# Patient Record
Sex: Male | Born: 1995 | Race: Black or African American | Hispanic: No | Marital: Single
Health system: Midwestern US, Community
[De-identification: ages and names within clinical notes are randomized; demographics above are authoritative.]

---
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
## 2017-06-01 ENCOUNTER — Emergency Department (HOSPITAL_COMMUNITY)
Admission: EM | Admit: 2017-06-01 | Discharge: 2017-06-01 | Disposition: A | Discharge status: Home / Self Care | Payer: 59 | Attending: Emergency Medicine | Admitting: Emergency Medicine

## 2017-06-01 ENCOUNTER — Encounter (HOSPITAL_COMMUNITY): Payer: Self-pay | Admitting: Emergency Medicine

## 2017-06-01 ENCOUNTER — Other Ambulatory Visit: Payer: Self-pay

## 2017-06-01 DIAGNOSIS — S50812A Abrasion of left forearm, initial encounter: Secondary | ICD-10-CM | POA: Insufficient documentation

## 2017-06-01 DIAGNOSIS — Y939 Activity, unspecified: Secondary | ICD-10-CM | POA: Insufficient documentation

## 2017-06-01 DIAGNOSIS — Y999 Unspecified external cause status: Secondary | ICD-10-CM | POA: Diagnosis not present

## 2017-06-01 DIAGNOSIS — F329 Major depressive disorder, single episode, unspecified: Secondary | ICD-10-CM | POA: Diagnosis present

## 2017-06-01 DIAGNOSIS — Z23 Encounter for immunization: Secondary | ICD-10-CM | POA: Diagnosis not present

## 2017-06-01 DIAGNOSIS — R45851 Suicidal ideations: Secondary | ICD-10-CM | POA: Insufficient documentation

## 2017-06-01 DIAGNOSIS — X781XXA Intentional self-harm by knife, initial encounter: Secondary | ICD-10-CM | POA: Insufficient documentation

## 2017-06-01 DIAGNOSIS — F4325 Adjustment disorder with mixed disturbance of emotions and conduct: Secondary | ICD-10-CM | POA: Diagnosis not present

## 2017-06-01 DIAGNOSIS — Y929 Unspecified place or not applicable: Secondary | ICD-10-CM | POA: Insufficient documentation

## 2017-06-01 LAB — COMPREHENSIVE METABOLIC PANEL
ALBUMIN: 4.5 g/dL (ref 3.5–5.0)
ALT: 26 U/L (ref 17–63)
AST: 35 U/L (ref 15–41)
Alkaline Phosphatase: 55 U/L (ref 38–126)
Anion gap: 11 (ref 5–15)
BUN: 12 mg/dL (ref 6–20)
CALCIUM: 9.2 mg/dL (ref 8.9–10.3)
CO2: 23 mmol/L (ref 22–32)
CREATININE: 1.02 mg/dL (ref 0.61–1.24)
Chloride: 107 mmol/L (ref 101–111)
GFR calc Af Amer: >60 mL/min (ref >60)
GFR calc non Af Amer: >60 mL/min (ref >60)
GLUCOSE: 83 mg/dL (ref 65–99)
Potassium: 3.4 mmol/L — ABNORMAL LOW (ref 3.5–5.1)
SODIUM: 141 mmol/L (ref 135–145)
Total Bilirubin: 0.7 mg/dL (ref 0.3–1.2)
Total Protein: 7.8 g/dL (ref 6.5–8.1)

## 2017-06-01 LAB — CBC
HEMATOCRIT: 44.5 % (ref 39.0–52.0)
HEMOGLOBIN: 14.7 g/dL (ref 13.0–17.0)
MCH: 28.1 pg (ref 26.0–34.0)
MCHC: 33 g/dL (ref 30.0–36.0)
MCV: 84.9 fL (ref 78.0–100.0)
Platelets: 241 10*3/uL (ref 150–400)
RBC: 5.24 MIL/uL (ref 4.22–5.81)
RDW: 14 % (ref 11.5–15.5)
WBC: 5.1 10*3/uL (ref 4.0–10.5)

## 2017-06-01 LAB — SALICYLATE LEVEL: Salicylate Lvl: <7 mg/dL (ref 2.8–30.0)

## 2017-06-01 LAB — ETHANOL: Alcohol, Ethyl (B): <10 mg/dL (ref <10)

## 2017-06-01 LAB — ACETAMINOPHEN LEVEL: Acetaminophen (Tylenol), Serum: <10 ug/mL — ABNORMAL LOW (ref 10–30)

## 2017-06-01 MED ORDER — ACETAMINOPHEN 500 MG PO TABS: 500.0000 mg | ORAL_TABLET | ORAL | Status: DC | PRN

## 2017-06-01 MED ORDER — IBUPROFEN 200 MG PO TABS: 600.0000 mg | ORAL_TABLET | Freq: Four times a day (QID) | ORAL | Status: DC | PRN

## 2017-06-01 MED ORDER — TETANUS-DIPHTH-ACELL PERTUSSIS 5-2.5-18.5 LF-MCG/0.5 IM SUSP
0.5000 mL | Freq: Once | INTRAMUSCULAR | Status: AC
Start: 2017-06-01 — End: 2017-06-01
  Administered 2017-06-01: 0.5 mL via INTRAMUSCULAR
  Filled 2017-06-01: qty 0.5
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
## 2017-06-01 NOTE — ED Notes (Signed)
Bed: [facility] Expected date:  Expected time:  Means of arrival:  Comments: 

## 2017-06-01 NOTE — ED Notes (Signed)
Pt d/c home per MD order. Discharge summary reviewed with pt. Pt verbalizes understanding. Pt denies SI/HI/AVH. Personal property returned to pt. Pt signed e-signature. Pt mother in lobby for discharge. Pt ambulatory off unit with [facility].
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
## 2017-06-01 NOTE — ED Notes (Signed)
Pt mother at bedside visiting with pt.  
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
## 2017-06-01 NOTE — ED Notes (Addendum)
Mother took pt belongings home  

## 2017-06-01 NOTE — BHH Counselor (Signed)
Per Nanine MeansJamison Lord, NP, patient does not meet inpatient criteria. Patient is psych-cleared.

## 2019-04-29 ENCOUNTER — Emergency Department (HOSPITAL_COMMUNITY)
Admission: EM | Admit: 2019-04-29 | Discharge: 2019-04-30 | Disposition: A | Discharge status: Other Acute Inpatient Hospital | Payer: 59 | Source: Home / Self Care | Attending: Emergency Medicine | Admitting: Emergency Medicine

## 2019-04-29 ENCOUNTER — Other Ambulatory Visit: Payer: Self-pay

## 2019-04-29 DIAGNOSIS — F332 Major depressive disorder, recurrent severe without psychotic features: Secondary | ICD-10-CM | POA: Insufficient documentation

## 2019-04-29 DIAGNOSIS — F322 Major depressive disorder, single episode, severe without psychotic features: Secondary | ICD-10-CM | POA: Diagnosis not present

## 2019-04-29 DIAGNOSIS — R45851 Suicidal ideations: Secondary | ICD-10-CM

## 2019-04-29 DIAGNOSIS — Z046 Encounter for general psychiatric examination, requested by authority: Secondary | ICD-10-CM | POA: Insufficient documentation

## 2019-04-29 DIAGNOSIS — Z20822 Contact with and (suspected) exposure to covid-19: Secondary | ICD-10-CM | POA: Insufficient documentation

## 2019-04-29 LAB — COMPREHENSIVE METABOLIC PANEL
ALT: 39 U/L (ref 0–44)
AST: 26 U/L (ref 15–41)
Albumin: 4.4 g/dL (ref 3.5–5.0)
Alkaline Phosphatase: 51 U/L (ref 38–126)
Anion gap: 11 (ref 5–15)
BUN: 8 mg/dL (ref 6–20)
CO2: 24 mmol/L (ref 22–32)
Calcium: 9.2 mg/dL (ref 8.9–10.3)
Chloride: 105 mmol/L (ref 98–111)
Creatinine, Ser: 0.89 mg/dL (ref 0.61–1.24)
GFR calc Af Amer: >60 mL/min (ref >60)
GFR calc non Af Amer: >60 mL/min (ref >60)
Glucose, Bld: 157 mg/dL — ABNORMAL HIGH (ref 70–99)
Potassium: 3.6 mmol/L (ref 3.5–5.1)
Sodium: 140 mmol/L (ref 135–145)
Total Bilirubin: 0.4 mg/dL (ref 0.3–1.2)
Total Protein: 7.6 g/dL (ref 6.5–8.1)

## 2019-04-29 LAB — SALICYLATE LEVEL: Salicylate Lvl: <7 mg/dL — ABNORMAL LOW (ref 7.0–30.0)

## 2019-04-29 LAB — ACETAMINOPHEN LEVEL: Acetaminophen (Tylenol), Serum: <10 ug/mL — ABNORMAL LOW (ref 10–30)

## 2019-04-29 LAB — CBC
HCT: 49.2 % (ref 39.0–52.0)
Hemoglobin: 15.9 g/dL (ref 13.0–17.0)
MCH: 28.4 pg (ref 26.0–34.0)
MCHC: 32.3 g/dL (ref 30.0–36.0)
MCV: 88 fL (ref 80.0–100.0)
Platelets: 296 10*3/uL (ref 150–400)
RBC: 5.59 MIL/uL (ref 4.22–5.81)
RDW: 14.2 % (ref 11.5–15.5)
WBC: 4.4 10*3/uL (ref 4.0–10.5)
nRBC: 0 % (ref 0.0–0.2)

## 2019-04-29 LAB — ETHANOL: Alcohol, Ethyl (B): 54 mg/dL — ABNORMAL HIGH (ref <10)

## 2019-04-29 NOTE — ED Provider Notes (Signed)
[facility] EMERGENCY DEPARTMENT Provider Note   CSN: [id number] Arrival date & time: 04/29/19  2223     History Chief Complaint  Patient presents with  . Suicidal    Steve Turner is a 24 y.o. male.  Patient presents to the emergency department with a chief complaint of suicidal thoughts.  He states that his suicidal thoughts have been worse than normal.  States that he tried to drink some engine fluid today, but his girlfriend stopped him.  He states that he vomited shortly thereafter.  He denies any symptoms now, but does feel suicidal still.  Denies any homicidal thoughts.  Denies any other drug or alcohol use.  Denies any recent illnesses.  The history is provided by the patient. No language interpreter was used.       No past medical history on file.  Patient Active Problem List   Diagnosis Date Noted  . Adjustment disorder with mixed disturbance of emotions and conduct 06/01/2017    No past surgical history on file.     No family history on file.  Social History   Tobacco Use  . Smoking status: Never Smoker  Substance Use Topics  . Alcohol use: Not on file  . Drug use: Not on file    Home Medications Prior to Admission medications   Not on File    Allergies    Patient has no known allergies.  Review of Systems   Review of Systems  All other systems reviewed and are negative.   Physical Exam Updated Vital Signs BP 133/72   Pulse 98   Temp 98.2 F (36.8 C) (Oral)   Resp 16   SpO2 98%   Physical Exam Vitals and nursing note reviewed.  Constitutional:      General: He is not in acute distress.    Appearance: He is well-developed. He is not ill-appearing.  HENT:     Head: Normocephalic and atraumatic.  Eyes:     Conjunctiva/sclera: Conjunctivae normal.  Cardiovascular:     Rate and Rhythm: Normal rate.  Pulmonary:     Effort: Pulmonary effort is normal. No respiratory distress.  Abdominal:     General: There is no  distension.  Musculoskeletal:     Cervical back: Neck supple.     Comments: Moves all extremities  Skin:    General: Skin is warm and dry.  Neurological:     Mental Status: He is alert and oriented to person, place, and time.  Psychiatric:        Mood and Affect: Mood normal.        Behavior: Behavior normal.     ED Results / Procedures / Treatments   Labs (all labs ordered are listed, but only abnormal results are displayed) Labs Reviewed  COMPREHENSIVE METABOLIC PANEL - Abnormal; Notable for the following components:      Result Value   Glucose, Bld 157 (*)    All other components within normal limits  ETHANOL - Abnormal; Notable for the following components:   Alcohol, Ethyl (B) 54 (*)    All other components within normal limits  SALICYLATE LEVEL - Abnormal; Notable for the following components:   Salicylate Lvl <5.3 (*)    All other components within normal limits  ACETAMINOPHEN LEVEL - Abnormal; Notable for the following components:   Acetaminophen (Tylenol), Serum <10 (*)    All other components within normal limits  CBC  RAPID URINE DRUG SCREEN, HOSP PERFORMED    EKG None  Radiology No results found.  Procedures Procedures (including critical care time)  Medications Ordered in ED Medications - No data to display  ED Course  I have reviewed the triage vital signs and the nursing notes.  Pertinent labs & imaging results that were available during my care of the patient were reviewed by me and considered in my medical decision making (see chart for details).    MDM Rules/Calculators/A&P                       Patient here with suicidal thoughts.  States he attempted to drink engine fluid, but vomited.  Vital signs are stable.  He is nontoxic-appearing.  He does not appear acidotic.    Patient is medically clear for TTS evaluation.  Final Clinical Impression(s) / ED Diagnoses Final diagnoses:  Suicidal thoughts    Rx / DC Orders ED Discharge  Orders    None       Roxy Horseman, PA-C 04/30/19 0001    Cardama, Amadeo Garnet, MD 04/30/19 [phone]
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
## 2019-04-29 NOTE — ED Notes (Signed)
Patient changed into scrubs, has been wanded by [facility] ED Security and is currently sitting in [employer]. Pts Belongings are in [address] [place] [address].

## 2019-04-30 ENCOUNTER — Encounter (HOSPITAL_COMMUNITY): Payer: Self-pay | Admitting: Nurse Practitioner

## 2019-04-30 ENCOUNTER — Inpatient Hospital Stay (HOSPITAL_COMMUNITY)
Admission: AD | Admit: 2019-04-30 | Discharge: 2019-05-04 | DRG: 885 | Disposition: A | Discharge status: Home / Self Care | Payer: 59 | Source: Intra-hospital | Attending: Psychiatry | Admitting: Psychiatry

## 2019-04-30 DIAGNOSIS — Y902 Blood alcohol level of 40-59 mg/100 ml: Secondary | ICD-10-CM | POA: Diagnosis present

## 2019-04-30 DIAGNOSIS — Z23 Encounter for immunization: Secondary | ICD-10-CM

## 2019-04-30 DIAGNOSIS — Z915 Personal history of self-harm: Secondary | ICD-10-CM

## 2019-04-30 DIAGNOSIS — F17290 Nicotine dependence, other tobacco product, uncomplicated: Secondary | ICD-10-CM | POA: Diagnosis present

## 2019-04-30 DIAGNOSIS — Z20822 Contact with and (suspected) exposure to covid-19: Secondary | ICD-10-CM | POA: Diagnosis present

## 2019-04-30 DIAGNOSIS — F322 Major depressive disorder, single episode, severe without psychotic features: Secondary | ICD-10-CM | POA: Diagnosis present

## 2019-04-30 DIAGNOSIS — R45851 Suicidal ideations: Secondary | ICD-10-CM | POA: Diagnosis present

## 2019-04-30 DIAGNOSIS — F419 Anxiety disorder, unspecified: Secondary | ICD-10-CM | POA: Diagnosis present

## 2019-04-30 DIAGNOSIS — F1024 Alcohol dependence with alcohol-induced mood disorder: Secondary | ICD-10-CM | POA: Diagnosis present

## 2019-04-30 LAB — LIPID PANEL
Cholesterol: 121 mg/dL (ref 0–200)
HDL: 46 mg/dL (ref >40)
LDL Cholesterol: 58 mg/dL (ref 0–99)
Total CHOL/HDL Ratio: 2.6 RATIO
Triglycerides: 86 mg/dL (ref <150)
VLDL: 17 mg/dL (ref 0–40)

## 2019-04-30 LAB — HEMOGLOBIN A1C
Hgb A1c MFr Bld: 5.3 % (ref 4.8–5.6)
Mean Plasma Glucose: 105.41 mg/dL

## 2019-04-30 LAB — RESPIRATORY PANEL BY RT PCR (FLU A&B, COVID)
Influenza A by PCR: NEGATIVE
Influenza B by PCR: NEGATIVE
SARS Coronavirus 2 by RT PCR: NEGATIVE

## 2019-04-30 LAB — TSH: TSH: 1.483 u[IU]/mL (ref 0.350–4.500)

## 2019-04-30 MED ORDER — LORAZEPAM 1 MG PO TABS: 1.0000 mg | ORAL_TABLET | Freq: Four times a day (QID) | ORAL | Status: AC | PRN

## 2019-04-30 MED ORDER — ACETAMINOPHEN 325 MG PO TABS: 650.0000 mg | ORAL_TABLET | Freq: Four times a day (QID) | ORAL | Status: DC | PRN

## 2019-04-30 MED ORDER — OLANZAPINE 10 MG PO TBDP: 10.0000 mg | ORAL_TABLET | Freq: Three times a day (TID) | ORAL | Status: DC | PRN

## 2019-04-30 MED ORDER — NICOTINE 14 MG/24HR TD PT24
14.0000 mg | MEDICATED_PATCH | Freq: Every day | TRANSDERMAL | Status: DC
  Administered 2019-04-30: 14 mg via TRANSDERMAL
  Filled 2019-04-30 (×3): qty 1

## 2019-04-30 MED ORDER — ZIPRASIDONE MESYLATE 20 MG IM SOLR: 20.0000 mg | INTRAMUSCULAR | Status: DC | PRN

## 2019-04-30 MED ORDER — HYDROXYZINE HCL 25 MG PO TABS
25.0000 mg | ORAL_TABLET | Freq: Three times a day (TID) | ORAL | Status: DC | PRN
  Administered 2019-05-02: 25 mg via ORAL
  Filled 2019-04-30 (×2): qty 1

## 2019-04-30 MED ORDER — PNEUMOCOCCAL VAC POLYVALENT 25 MCG/0.5ML IJ INJ
0.5000 mL | INJECTION | INTRAMUSCULAR | Status: AC
  Administered 2019-05-01: 0.5 mL via INTRAMUSCULAR

## 2019-04-30 MED ORDER — MAGNESIUM HYDROXIDE 400 MG/5ML PO SUSP: 30.0000 mL | Freq: Every day | ORAL | Status: DC | PRN

## 2019-04-30 MED ORDER — LOPERAMIDE HCL 2 MG PO CAPS: 2.0000 mg | ORAL_CAPSULE | ORAL | Status: DC | PRN

## 2019-04-30 MED ORDER — LORAZEPAM 1 MG PO TABS: 1.0000 mg | ORAL_TABLET | ORAL | Status: DC | PRN

## 2019-04-30 MED ORDER — THIAMINE HCL 100 MG PO TABS
100.0000 mg | ORAL_TABLET | Freq: Every day | ORAL | Status: DC
  Administered 2019-05-01 – 2019-05-04 (×4): 100 mg via ORAL
  Filled 2019-04-30 (×6): qty 1

## 2019-04-30 MED ORDER — TRAZODONE HCL 50 MG PO TABS: 50.0000 mg | ORAL_TABLET | Freq: Every evening | ORAL | Status: DC | PRN

## 2019-04-30 MED ORDER — ALUM & MAG HYDROXIDE-SIMETH 200-200-20 MG/5ML PO SUSP: 30.0000 mL | ORAL | Status: DC | PRN

## 2019-04-30 MED ORDER — ADULT MULTIVITAMIN W/MINERALS CH
1.0000 | ORAL_TABLET | Freq: Every day | ORAL | Status: DC
  Administered 2019-04-30 – 2019-05-04 (×5): 1 via ORAL
  Filled 2019-04-30 (×7): qty 1

## 2019-04-30 MED ORDER — ONDANSETRON 4 MG PO TBDP: 4.0000 mg | ORAL_TABLET | Freq: Four times a day (QID) | ORAL | Status: DC | PRN

## 2019-04-30 MED ORDER — FLUOXETINE HCL 10 MG PO CAPS
10.0000 mg | ORAL_CAPSULE | Freq: Every day | ORAL | Status: DC
  Administered 2019-04-30 – 2019-05-01 (×2): 10 mg via ORAL
  Filled 2019-04-30 (×3): qty 1
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
## 2019-04-30 NOTE — BH Assessment (Signed)
Nira Conn, NP, reviewed pt's chart and information and determined pt meets criteria for inpatient hospitalization. Pt has been accepted to [facility] pending a negative COVID test and can arrive anytime after the test results return negative. This information was provided to pt's nurse, Michele Mcalpine RN, at [phone].  Room: 301-2 Accepting: Nira Conn, NP Attending: Dr. Jola Babinski Call to Report: [phone]

## 2019-04-30 NOTE — BHH Suicide Risk Assessment (Addendum)
[facility] Admission Suicide Risk Assessment   Nursing information obtained from:  Patient Demographic factors:  Male, Low socioeconomic status, Living alone, Adolescent or young adult Current Mental Status:  NA Loss Factors:  Legal issues, Financial problems / change in socioeconomic status Historical Factors:  Prior suicide attempts, Impulsivity Risk Reduction Factors:  Employed, Positive social support  Total Time spent with patient: 45 minutes Principal Problem:Diagnosis:  Active Problems:   Severe major depression, single episode, without psychotic features (HCC)  Subjective Data:   Continued Clinical Symptoms:  Alcohol Use Disorder Identification Test Final Score (AUDIT): 23 The "Alcohol Use Disorders Identification Test", Guidelines for Use in Primary Care, Second Edition.  World Science writer [facility]). Score between 0-7:  no or low risk or alcohol related problems. Score between 8-15:  moderate risk of alcohol related problems. Score between 16-19:  high risk of alcohol related problems. Score 20 or above:  warrants further diagnostic evaluation for alcohol dependence and treatment.   CLINICAL FACTORS:  15 y old single male, no children, homeless, had been staying in a hotel prior to admission , reports recently lost his job due to having COVID and being unable to work. Presented to hospital voluntarily for worsening depression( which he attributes to above stressors)  neuro-vegetative symptoms ( mild anhedonia, decreased sleep, low energy level) and suicide attempt by ingesting " engine fluid" yesterday. States he ingested a slight amount before being stopped, and reports " then I started vomiting". ( no current vomiting, and reports tolerating PO intake well )  Denies psychotic symptoms. He also reports daily alcohol consumption, drinking up to a fifth of liquor per day for several months. At this time does not endorse drug abuse.  Admission BAL on 1/18 was 54, no UDS.  Patient  reports history of depression, intermittent suicidal ideations " since I was a kid", and a prior history of cutting self . Reports history of brief episodes of anger/explosiveness, but does not endorse any clear history of hypomania or mania.  Dx- MDD versus Alcohol Induced Mood Disorder . Alcohol Use Disorder  Plan- Inpatient treatment.  Alcohol detox protocol ( Ativan PRN as per CIWA) , thiamine, folate.  Has been started on Prozac 10 mgrs QDAY for depression- side effects reviewed .  Agitation protocol if needed for acute agitation.      Musculoskeletal: Strength & Muscle Tone: within normal limits no tremors, no diaphoresis, no restlessness or agitation, vitals stable. Gait & Station: normal Patient leans: N/A  Psychiatric Specialty Exam: Physical Exam  Review of Systems denies chest pain or shortness of breath, no vomiting   Blood pressure 110/68, pulse 73, temperature 98.1 F (36.7 C), temperature source Oral, resp. rate 16, height 5\' 7"  (1.702 m), weight 68 kg, SpO2 100 %.Body mass index is 23.49 kg/m.  General Appearance: Well Groomed  Eye Contact:  Fair  Speech:  Normal Rate  Volume:  Normal  Mood:  depressed   Affect:  Constricted  Thought Process:  Linear and Descriptions of Associations: Intact  Orientation:  Other:  fully alert and attentive   Thought Content:  no hallucinations,  no delusions , not internally preoccupied   Suicidal Thoughts:  No no suicidal or self injurious ideations  Homicidal Thoughts:  No  Memory:  recent and remote grossly intact   Judgement:  Fair  Insight:  Good  Psychomotor Activity:  Normal- he is currently not presenting with withdrawal symptoms- no tremors, no restlessness or psychomotor agitation  Concentration:  Concentration: Good and Attention  Span: Good  Recall:  Good  Fund of Knowledge:  Good  Language:  Good  Akathisia:  Negative  Handed:  Right  AIMS (if indicated):     Assets:  Desire for Improvement Resilience  ADL's:   Intact  Cognition:  WNL  Sleep:  Number of Hours: 0.75(d/t early morning admission)      COGNITIVE FEATURES THAT CONTRIBUTE TO RISK:  Closed-mindedness and Loss of executive function    SUICIDE RISK:   Moderate:  Frequent suicidal ideation with limited intensity, and duration, some specificity in terms of plans, no associated intent, good self-control, limited dysphoria/symptomatology, some risk factors present, and identifiable protective factors, including available and accessible social support.  PLAN OF CARE: Patient will be admitted to inpatient psychiatric unit for stabilization and safety. Will provide and encourage milieu participation. Provide medication management and maked adjustments as needed.  Will also provide medication to address alcohol WDL if needed. Will follow daily.    I certify that inpatient services furnished can reasonably be expected to improve the patient's condition.   Jenne Campus, MD 04/30/2019, 6:09 PM

## 2019-04-30 NOTE — Progress Notes (Signed)
   04/30/19 0420  Assessment Details  Time of Assessment Admission  Information Obtained From Patient  To be done on Admission  Risk Factors related to Demographics Male;Low socioeconomic status;Living alone;Adolescent or young adult  Current Mental Status NA  Loss Factors Legal issues;Financial problems / change in socioeconomic status  Historical Factors Prior suicide attempts;Impulsivity  Risk Reduction Factors Employed;Positive social support  [place] Suicide Severity Rating Scale  1. Wish to be Dead Yes  2. Suicidal Thoughts Yes  3. Suicidal Thoughts with Method Without Specific Plan or Intent to Act Yes  4. Suicidal Intent Without Specific Plan Yes  5. Suicide Intent with Specific Plan No  6. Suicide Behavior Question Yes  7. How long ago did you do any of these? Within the last three months  C-SSRS RISK CATEGORY High Risk  Patient location: Behavioral Health In-patient Unit  [facility] Suicide Precaution Interventions  [facility] Suicide Bundle Interventions Moderate Risk Interventions implemented   Pt denies current suicidal thoughts. Contracts for safety.

## 2019-04-30 NOTE — BH Assessment (Addendum)
Tele Assessment Note   Patient Name: Steve Turner MRN: [id number] Referring Physician: Roxy Horseman, PA-C Location of Patient: [facility] Location of Provider: Behavioral Health TTS Department  Steve Turner is a 24 y.o. male who was brought to [facility] following an attempt to kill himself by drinking engine fluid; pt states he found this in the back of a male friend's car, sprayed it into a water bottle drank as much as he could, and then he almost immediately vomited it back up. Pt states he attempted to kill himself by drinking brake fluid in a similar manner several weeks prior to Christmas in 2020 (approximately 6 weeks ago); he stated that, in that incident, his throat closed up and he almost immediately vomited that substance up as well, and then he fell asleep for several hours.  Pt endorses SI and a hx of SI, stating, "I've been suicidal since high school." Pt stated he cut his wrists in January 2019 and that his father called the police; he was sent to [facility] and he was assessed overnight and d/c the next day. Pt acknowledges he wasn't completely honest during the assessment, stating he "knew what to say" so he didn't have to stay longer. Pt states he has "too many" plans to kill himself. Pt denies HI, AVH, and access to guns/weapons. He shares he engages in NSSIB via cutting, stating he will use "anything" to cut himself and that the last incident of this was the same day he drank the brake fluid prior to Christmas. Pt states he has pending legal charges, including DWI, paraphernalia, hit and run, and distribution of marijuana; he states he believes his court date is May 16, 2019. Pt states he currently engages in the use of EtOH daily and that he "definitely abuses" the substance; he states he has a hx of abusing marijuana, though can't remember the last time he used it, and abused pills when he was in high school.  Pt declined to provide clinician verbal consent to contact any family  members for collateral information.  Pt is oriented x4. His recent and remote memory is intact. Pt was cooperative and friendly throughout the assessment process. Pt's insight and judgement is fair at this time; his impulse control is poor.   Diagnosis: F33.2, Major depressive disorder, Recurrent episode, Severe   Past Medical History: No past medical history on file.  No past surgical history on file.  Family History: No family history on file.  Social History:  reports that he has never smoked. He does not have any smokeless tobacco history on file. No history on file for alcohol and drug.  Additional Social History:  Alcohol / Drug Use Pain Medications: Please see MRN Prescriptions: Please see MRN Over the Counter: Please see MRN History of alcohol / drug use?: Yes Longest period of sobriety (when/how long): Unknown Substance #1 Name of Substance 1: EtOH 1 - Age of First Use: 18 1 - Amount (size/oz): Depends on funds; could use 2-3 40 ounce beers or 1-2 1/5ths of whiskey 1 - Frequency: Daily 1 - Duration: Unknown 1 - Last Use / Amount: 04/28/2018 Substance #2 Name of Substance 2: Marijuana 2 - Age of First Use: Unknown 2 - Amount (size/oz): Unknown 2 - Frequency: Unknown 2 - Duration: Unknown 2 - Last Use / Amount: Can not remember  CIWA: CIWA-Ar BP: 133/72 Pulse Rate: 98 COWS:    Allergies: No Known Allergies  Home Medications: (Not in a hospital admission)   OB/GYN  Status:  No LMP for male patient.  General Assessment Data Location of Assessment: [facility] ED TTS Assessment: In system Is this a Tele or Face-to-Face Assessment?: Tele Assessment Is this an Initial Assessment or a Re-assessment for this encounter?: Initial Assessment Patient Accompanied by:: N/A Language Other than English: No Living Arrangements: Homeless/Shelter What gender do you identify as?: Male Marital status: Single Living Arrangements: Alone Can pt return to current living arrangement?:  Yes Admission Status: Voluntary Is patient capable of signing voluntary admission?: Yes Referral Source: Self/Family/Friend Insurance type: [place] Living Arrangements: Alone Legal Guardian: Other:(Self) Name of Psychiatrist: None Name of Therapist: None  Education Status Is patient currently in school?: No Is the patient employed, unemployed or receiving disability?: (Pt has been offered several temp jobs; has not started yet)  Risk to self with the past 6 months Suicidal Ideation: Yes-Currently Present Has patient been a risk to self within the past 6 months prior to admission? : Yes Suicidal Intent: Yes-Currently Present Has patient had any suicidal intent within the past 6 months prior to admission? : Yes Is patient at risk for suicide?: Yes Suicidal Plan?: Yes-Currently Present Has patient had any suicidal plan within the past 6 months prior to admission? : Yes Specify Current Suicidal Plan: Pt attempted to kill self by drinking engine oil today Access to Means: Yes Specify Access to Suicidal Means: Pt found random engine oil, sprayed it into a water bottle, & drank it What has been your use of drugs/alcohol within the last 12 months?: Pt acknowledges daily EtOH use and some marijuana use Previous Attempts/Gestures: Yes How many times?: 2((3 after today)) Other Self Harm Risks: Pt is homeless, engages in NSSIB via cutting, has limited supports Triggers for Past Attempts: Family contact, Unpredictable Intentional Self Injurious Behavior: Cutting Comment - Self Injurious Behavior: Pt engages in NSSIB via cutting w/ "anything" Family Suicide History: No Recent stressful life event(s): Conflict (Comment), Loss (Comment), Job Loss, Other (Comment), Legal Issues(Sick w/ COVID for 1 month, lost car, lost home, is homeless) Persecutory voices/beliefs?: No Depression: Yes Depression Symptoms: Despondent, Fatigue, Guilt, Loss of interest in usual  pleasures, Feeling worthless/self pity Substance abuse history and/or treatment for substance abuse?: Yes Suicide prevention information given to non-admitted patients: Not applicable  Risk to Others within the past 6 months Homicidal Ideation: No Does patient have any lifetime risk of violence toward others beyond the six months prior to admission? : No Thoughts of Harm to Others: No Current Homicidal Intent: No Current Homicidal Plan: No Access to Homicidal Means: No Identified Victim: None noted History of harm to others?: No Assessment of Violence: None Noted Violent Behavior Description: None noted Does patient have access to weapons?: No(Pt denies access to guns/weapons) Criminal Charges Pending?: Yes Describe Pending Criminal Charges: DWI, paraphanalia, hit & run, distribution of marijuana Does patient have a court date: Yes Court Date: 05/16/19 Is patient on probation?: No  Psychosis Hallucinations: None noted Delusions: None noted  Mental Status Report Appearance/Hygiene: In scrubs Eye Contact: Good Motor Activity: Freedom of movement, Other (Comment), Unremarkable(Pt is lying down in his hospital bed) Speech: Logical/coherent Level of Consciousness: Alert Mood: Depressed Affect: Appropriate to circumstance, Depressed Anxiety Level: Minimal Thought Processes: Coherent, Relevant Judgement: Impaired Orientation: Person, Place, Time, Situation Obsessive Compulsive Thoughts/Behaviors: None  Cognitive Functioning Concentration: Normal Memory: Recent Intact, Remote Intact Is patient IDD: No Insight: Fair Impulse Control: Poor Appetite: Good Have you had any weight changes? : No Change  Sleep: Decreased Total Hours of Sleep: 5(Pt has been sleeping in a car for several weeks) Vegetative Symptoms: None  ADLScreening [facility] Assessment Services) Patient's cognitive ability adequate to safely complete daily activities?: Yes Patient able to express need for assistance  with ADLs?: Yes Independently performs ADLs?: Yes (appropriate for developmental age)  Prior Inpatient Therapy Prior Inpatient Therapy: No  Prior Outpatient Therapy Prior Outpatient Therapy: Yes Prior Therapy Dates: Multiple Prior Therapy Facilty/Provider(s): Pt cannot remember Reason for Treatment: Depression Does patient have an ACCT team?: No Does patient have Intensive In-House Services?  : No Does patient have [facility] services? : No Does patient have P4CC services?: No  ADL Screening (condition at time of admission) Patient's cognitive ability adequate to safely complete daily activities?: Yes Is the patient deaf or have difficulty hearing?: No Does the patient have difficulty seeing, even when wearing glasses/contacts?: No Does the patient have difficulty concentrating, remembering, or making decisions?: No Patient able to express need for assistance with ADLs?: Yes Does the patient have difficulty dressing or bathing?: No Independently performs ADLs?: Yes (appropriate for developmental age) Does the patient have difficulty walking or climbing stairs?: No Weakness of Legs: None Weakness of Arms/Hands: None  Home Assistive Devices/Equipment Home Assistive Devices/Equipment: None  Therapy Consults (therapy consults require a physician order) PT Evaluation Needed: No OT Evalulation Needed: No SLP Evaluation Needed: No Abuse/Neglect Assessment (Assessment to be complete while patient is alone) Abuse/Neglect Assessment Can Be Completed: Yes Physical Abuse: Denies Verbal Abuse: Denies Sexual Abuse: Denies Exploitation of patient/patient's resources: Denies Self-Neglect: Denies Values / Beliefs Cultural Requests During Hospitalization: None Spiritual Requests During Hospitalization: None Consults Spiritual Care Consult Needed: No Transition of Care Team Consult Needed: No Advance Directives (For Healthcare) Does Patient Have a Medical Advance Directive?: No Would  patient like information on creating a medical advance directive?: No - Patient declined          Disposition: Nira Conn, NP, reviewed pt's chart and information and determined pt meets criteria for inpatient hospitalization. Pt has been accepted to [facility] pending a negative COVID test and can arrive anytime after the test results return negative. This information was provided to pt's nurse, Michele Mcalpine RN, at [phone].  Room: 301-2 Accepting: Nira Conn, NP Attending: Dr. Jola Babinski Call to Report: [phone]   Disposition Initial Assessment Completed for this Encounter: Yes  This service was provided via telemedicine using a 2-way, interactive audio and video technology.  Names of all persons participating in this telemedicine service and their role in this encounter. Name: Evaristo Bury Role: Patient  Name: Nira Conn Role: Nurse Practitioner  Name: Duard Brady Role: Clinician    Ralph Dowdy 04/30/2019 1:26 AM

## 2019-04-30 NOTE — Plan of Care (Signed)
  Problem: Education: Goal: Knowledge of Lupus General Education information/materials will improve Outcome: Progressing   Problem: Coping: Goal: Ability to demonstrate self-control will improve Outcome: Progressing   Problem: Education: Goal: Ability to make informed decisions regarding treatment will improve Outcome: Progressing

## 2019-04-30 NOTE — Progress Notes (Signed)
Recreation Therapy Notes  Animal-Assisted Activity (AAA) Program Checklist/Progress Notes Patient Eligibility Criteria Checklist & Daily Group note for Rec Tx Intervention  Date: 1.19.21 Time: 1430 Location: [address]   AAA/T Program Assumption of Risk Form signed by Engineer, production or Parent Legal Guardian YES   Patient is free of allergies or sever asthma  YES   Patient reports no fear of animals  YES   Patient reports no history of cruelty to animals YES   Patient understands his/her participation is voluntary YES   Patient washes hands before animal contact  YES   Patient washes hands after animal contact YES  Behavioral Response: Engaged  Education: Charity fundraiser, Appropriate Animal Interaction   Education Outcome: Acknowledges understanding/In group clarification offered/Needs additional education.   Clinical Observations/Feedback:  Patient attended and participated in group activity.    Caroll Rancher, LRT/CTRS         Caroll Rancher A 04/30/2019 3:31 PM

## 2019-04-30 NOTE — Tx Team (Signed)
Initial Treatment Plan 04/30/2019 4:46 AM Evaristo Bury CVE:[id number]    PATIENT STRESSORS: Financial difficulties Legal issue Loss of apartment d/t sick with covid for a month   PATIENT STRENGTHS: Average or above average intelligence Capable of independent living Motivation for treatment/growth Physical Health   PATIENT IDENTIFIED PROBLEMS: Suicidal ideation with plan and intent  Loss of apartment d/t sick with Covid for a month  Depression  Homeless               DISCHARGE CRITERIA:  Adequate post-discharge living arrangements Improved stabilization in mood, thinking, and/or behavior Motivation to continue treatment in a less acute level of care Verbal commitment to aftercare and medication compliance  PRELIMINARY DISCHARGE PLAN: Attend PHP/IOP Outpatient therapy Placement in alternative living arrangements  PATIENT/FAMILY INVOLVEMENT: This treatment plan has been presented to and reviewed with the patient, Steve Turner, and/or family member.  The patient and family have been given the opportunity to ask questions and make suggestions.  Victorino December, RN 04/30/2019, 4:46 AM

## 2019-04-30 NOTE — ED Notes (Signed)
All appropriate discharge materials reviewed at length with patient. Time for questions provided. Pt has no other questions at this time and verbalizes understanding of all provided materials. Pt signed the voluntary consent form for transfer.

## 2019-04-30 NOTE — Progress Notes (Signed)
D: Pt presents as tired, stating he did not get much sleep due to early morning admission to the unit. He is cooperative with morning medications. Per patient self inventory, he has good appetite, normal energy level, good concentration. Rates depression #4. Rates hopelessness #3. Rates anxiety #4. Reports withdrawing from medications, denies any physical issues. Denies thoughts of suicide or self harm. Denies physical pain. States he is unsure of his goal today.   A: Pt offered support and encouragement. Encouraged to have adequate fluid intake. Encouraged to express concerns and ask questions. Pt verbalized understanding.  R: Patient denies SI, HI, A/V hallucinations. Safety maintained with 15 minute safety checks.

## 2019-04-30 NOTE — ED Notes (Signed)
Steve Berry, NP, reviewed pt's chart and information and determined pt meets criteria for inpatient hospitalization. Pt has been accepted to [facility] pending a negative COVID test and can arrive anytime after the test results return negative. This information was provided to pt's nurse, Phil RN, at 0114.  Room: 301-2 Accepting: Steve Berry, NP Attending: Dr. Clary Call to Report: [phone] 

## 2019-04-30 NOTE — Progress Notes (Addendum)
   04/30/19 0432  Psych Admission Type (Psych Patients Only)  Admission Status Voluntary  Psychosocial Assessment  Patient Complaints Loneliness;Hopelessness  Eye Contact Fair  Facial Expression Flat;Sad  Affect Depressed  Speech Logical/coherent  Interaction Assertive  Motor Activity Other (Comment) (WNL)  Appearance/Hygiene In scrubs;Unremarkable  Behavior Characteristics Cooperative  Mood Depressed  Aggressive Behavior  Effect No apparent injury  Thought Process  Coherency WDL  Content WDL  Delusions None reported or observed  Perception WDL  Hallucination None reported or observed  Judgment Impaired  Confusion None  Danger to Self  Current suicidal ideation? Denies  Self-Injurious Behavior No self-injurious ideation or behavior indicators observed or expressed   Agreement Not to Harm Self Yes  Description of Agreement verbal  Danger to Others  Danger to Others None reported or observed   Pt says that he is unsure of his lodgings after discharge. Spoke with mother who says she is still upset about a past incident and denied pt a place to stay. Father, whom patient says tries to be supportive, denied lodging on the basis of patient having had coronavirus. Pt hoping that paperwork at discharge showing him to be Covid negative will persuade father to let him stay until he gets on his feet. Pt support system right now is a male friend.

## 2019-04-30 NOTE — [facility] Suicide Risk Assessment (Signed)
[facility] INPATIENT:  Family/Significant Other Suicide Prevention Education  Suicide Prevention Education:  Education Completed; with father, Corleone Biegler [phone]) has been identified by the patient as the family member/significant other with whom the patient will be residing, and identified as the person(s) who will aid the patient in the event of a mental health crisis (suicidal ideations/suicide attempt).  With written consent from the patient, the family member/significant other has been provided the following suicide prevention education, prior to the and/or following the discharge of the patient.  The suicide prevention education provided includes the following:  Suicide risk factors  Suicide prevention and interventions  National Suicide Hotline telephone number  [facility] assessment telephone number  [facility] Emergency Assistance 911  [facility] and/or Residential Mobile Crisis Unit telephone number  Request made of family/significant other to:  Remove weapons (e.g., guns, rifles, knives), all items previously/currently identified as safety concern.    Remove drugs/medications (over-the-counter, prescriptions, illicit drugs), all items previously/currently identified as a safety concern.  The family member/significant other verbalizes understanding of the suicide prevention education information provided.  The family member/significant other agrees to remove the items of safety concern listed above.  Marlon reports that the patient has an extensive history of suicidal ideation and suicide attempts. He shared the patient attempted suicide in the past by cutting himself with a butcher knife. Marlon reports the patient was diagnosed ADHD during his childhood, however when he became a teenager he became non complaint with his treatment plan. Marlon reports the patient has a history of abusing Xanax and cannabis. Marlon states the patient's doctor shared those  drugs combined with his medications could lead to suicidal ideation.   Marlon also reports the patient has issues with authority. He states the patient has "burned bridges" with multiple family members, including his mother. He states the patient hates to hear "no". Gwynneth Aliment states he offered to help the patient with substance use treatment in the past, however the patient declined.   Marlon reports he did not have any further questions or concerns at this time.   Maeola Sarah 04/30/2019, 10:20 AM

## 2019-04-30 NOTE — Progress Notes (Signed)
Received report from ED RN for pt coming to [facility] [id number].

## 2019-04-30 NOTE — ED Notes (Signed)
Pt in with tts

## 2019-04-30 NOTE — Progress Notes (Signed)
The patient learned today that he needs to change his environment and change who he associates with. He did not share a goal with the group.
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
## 2019-05-01 MED ORDER — NICOTINE 21 MG/24HR TD PT24
21.0000 mg | MEDICATED_PATCH | Freq: Every day | TRANSDERMAL | Status: DC
  Administered 2019-05-01 – 2019-05-04 (×4): 21 mg via TRANSDERMAL
  Filled 2019-05-01 (×5): qty 1

## 2019-05-01 MED ORDER — FLUOXETINE HCL 20 MG PO CAPS
20.0000 mg | ORAL_CAPSULE | Freq: Every day | ORAL | Status: DC
  Administered 2019-05-02 – 2019-05-04 (×3): 20 mg via ORAL
  Filled 2019-05-01 (×6): qty 1

## 2019-05-01 MED ORDER — ACAMPROSATE CALCIUM 333 MG PO TBEC
666.0000 mg | DELAYED_RELEASE_TABLET | Freq: Three times a day (TID) | ORAL | Status: DC
  Administered 2019-05-01 – 2019-05-04 (×8): 666 mg via ORAL
  Filled 2019-05-01 (×15): qty 2

## 2019-05-01 NOTE — Tx Team (Signed)
Interdisciplinary Treatment and Diagnostic Plan Update  05/01/2019 Time of Session:  Steve Turner MRN: [id number]  Principal Diagnosis: <principal problem not specified>  Secondary Diagnoses: Active Problems:   Severe major depression, single episode, without psychotic features (Steve Turner)   Alcohol dependence with alcohol-induced mood disorder (HCC)   Current Medications:  Current Facility-Administered Medications  Medication Dose Route Frequency Provider Last Rate Last Admin  . acetaminophen (TYLENOL) tablet 650 mg  650 mg Oral Q6H PRN Lindon Romp A, NP      . FLUoxetine (PROZAC) capsule 10 mg  10 mg Oral Daily Connye Burkitt, NP   10 mg at 05/01/19 3785  . hydrOXYzine (ATARAX/VISTARIL) tablet 25 mg  25 mg Oral TID PRN Rozetta Nunnery, NP      . LORazepam (ATIVAN) tablet 1 mg  1 mg Oral Q6H PRN Connye Burkitt, NP      . OLANZapine zydis (ZYPREXA) disintegrating tablet 10 mg  10 mg Oral Q8H PRN Cobos, Myer Peer, MD       And  . LORazepam (ATIVAN) tablet 1 mg  1 mg Oral PRN Cobos, Myer Peer, MD       And  . ziprasidone (GEODON) injection 20 mg  20 mg Intramuscular PRN Cobos, Myer Peer, MD      . multivitamin with minerals tablet 1 tablet  1 tablet Oral Daily Connye Burkitt, NP   1 tablet at 05/01/19 [phone]  . nicotine (NICODERM CQ - dosed in mg/24 hours) patch 21 mg  21 mg Transdermal Daily Cobos, Myer Peer, MD   21 mg at 05/01/19 2774  . thiamine tablet 100 mg  100 mg Oral Daily Connye Burkitt, NP   100 mg at 05/01/19 1287  . traZODone (DESYREL) tablet 50 mg  50 mg Oral QHS PRN Lindon Romp A, NP       PTA Medications: No medications prior to admission.    Patient Stressors: Arts development officer issue Loss of apartment d/t sick with covid for a month  Patient Strengths: Average or above average intelligence Capable of independent living Motivation for treatment/growth Physical Health  Treatment Modalities: Medication Management, Group therapy, Case management,  1 to 1  session with clinician, Psychoeducation, Recreational therapy.   Physician Treatment Plan for Primary Diagnosis: <principal problem not specified> Long Term Goal(s): Improvement in symptoms so as ready for discharge Improvement in symptoms so as ready for discharge   Short Term Goals: Ability to identify changes in lifestyle to reduce recurrence of condition will improve Ability to verbalize feelings will improve Ability to disclose and discuss suicidal ideas Ability to demonstrate self-control will improve Ability to identify and develop effective coping behaviors will improve  Medication Management: Evaluate patient's response, side effects, and tolerance of medication regimen.  Therapeutic Interventions: 1 to 1 sessions, Unit Group sessions and Medication administration.  Evaluation of Outcomes: Not Met  Physician Treatment Plan for Secondary Diagnosis: Active Problems:   Severe major depression, single episode, without psychotic features (Steve Turner)   Alcohol dependence with alcohol-induced mood disorder (Steve Turner)  Long Term Goal(s): Improvement in symptoms so as ready for discharge Improvement in symptoms so as ready for discharge   Short Term Goals: Ability to identify changes in lifestyle to reduce recurrence of condition will improve Ability to verbalize feelings will improve Ability to disclose and discuss suicidal ideas Ability to demonstrate self-control will improve Ability to identify and develop effective coping behaviors will improve     Medication Management: Evaluate patient's response, side effects, and  tolerance of medication regimen.  Therapeutic Interventions: 1 to 1 sessions, Unit Group sessions and Medication administration.  Evaluation of Outcomes: Not Met   RN Treatment Plan for Primary Diagnosis: <principal problem not specified> Long Term Goal(s): Knowledge of disease and therapeutic regimen to maintain health will improve  Short Term Goals: Ability to  participate in decision making will improve, Ability to disclose and discuss suicidal ideas, Ability to identify and develop effective coping behaviors will improve and Compliance with prescribed medications will improve  Medication Management: RN will administer medications as ordered by provider, will assess and evaluate patient's response and provide education to patient for prescribed medication. RN will report any adverse and/or side effects to prescribing provider.  Therapeutic Interventions: 1 on 1 counseling sessions, Psychoeducation, Medication administration, Evaluate responses to treatment, Monitor vital signs and CBGs as ordered, Perform/monitor CIWA, COWS, AIMS and Fall Risk screenings as ordered, Perform wound care treatments as ordered.  Evaluation of Outcomes: Not Met   LCSW Treatment Plan for Primary Diagnosis: <principal problem not specified> Long Term Goal(s): Safe transition to appropriate next level of care at discharge, Engage patient in therapeutic group addressing interpersonal concerns.  Short Term Goals: Engage patient in aftercare planning with referrals and resources  Therapeutic Interventions: Assess for all discharge needs, 1 to 1 time with Social worker, Explore available resources and support systems, Assess for adequacy in community support network, Educate family and significant other(s) on suicide prevention, Complete Psychosocial Assessment, Interpersonal group therapy.  Evaluation of Outcomes: Not Met   Progress in Treatment: Attending groups: No. Participating in groups: No. Taking medication as prescribed: Yes. Toleration medication: Yes. Family/Significant other contact made: Yes, individual(s) contacted:  the patient's father Patient understands diagnosis: Yes. Discussing patient identified problems/goals with staff: Yes. Medical problems stabilized or resolved: Yes. Denies suicidal/homicidal ideation: Yes. Issues/concerns per patient  self-inventory: No. Other:   New problem(s) identified: None   New Short Term/Long Term Goal(s): medication stabilization, elimination of SI thoughts, development of comprehensive mental wellness plan.    Patient Goals:    Discharge Plan or Barriers: Patient is currently homeless. Housing resources provided. Patient agreed to follow up with [facility] for outpatient medication management and therapy services.   Reason for Continuation of Hospitalization: Anxiety Depression Medication stabilization Suicidal ideation  Estimated Length of Stay: 3-5 days   Attendees: Patient: 05/01/2019 10:36 AM  Physician: Dr. Neita Garnet, MD 05/01/2019 10:36 AM  Nursing: Lowella Bandy RN 05/01/2019 10:36 AM  RN Care Manager: 05/01/2019 10:36 AM  Social Worker: Radonna Ricker, LCSW 05/01/2019 10:36 AM  Recreational Therapist:  05/01/2019 10:36 AM  Other:  05/01/2019 10:36 AM  Other:  05/01/2019 10:36 AM  Other: 05/01/2019 10:36 AM    Scribe for Treatment Team: Marylee Floras, [place] 05/01/2019 10:36 AM

## 2019-05-01 NOTE — Progress Notes (Addendum)
[facility] MD Progress Note  05/01/2019 12:33 PM Steve Turner  MRN:  [id number] Subjective:  Patient reports persistent depression and some anxiety. Denies suicidal ideations. Endorses alcohol cravings. Objective : I have discussed case with treatment team and have met with patient. 24 year old male. Presented to hospital voluntarily for worsening depression , neuro-vegetative symptoms of depression, and suicide attempt by attempting to ingest engine fluid. History of alcohol use disorder, reports he has been drinking up to a fifth of liquor daily , admission BAL 54. Reports long history of depression and intermittent suicidal ideations dating back to childhood.  Today patient reports feeling depressed but denies suicidal ideations, contracts for safety on unit . Denies alcohol WDL symptoms and does not present with tremors, diaphoresis, restlessness or psychomotor agitation. Vitals are stable.  He endorses alcohol cravings . Describes intermittent but intense thoughts of drinking on days he does not consume alcohol and states he sometimes craves alcohol when feeling anxious, as alcohol tends to alleviate anxiety temporarily. Currently denies medication side effects. Behavior on unit calm and in good control . Polite/pleasant on approach. Visible in day room.   Principal Problem: Alcohol Use Disorder, Depression  Diagnosis: Active Problems:   Severe major depression, single episode, without psychotic features ([place])   Alcohol dependence with alcohol-induced mood disorder ([facility])  Total Time spent with patient: 20 minutes  Past Psychiatric History:   Past Medical History: History reviewed. No pertinent past medical history. History reviewed. No pertinent surgical history. Family History: History reviewed. No pertinent family history. Family Psychiatric  History:  Social History:  Social History   Substance and Sexual Activity  Alcohol Use Yes   Comment: drinks most days-1 case of beer or 2 fifths of  liquor     Social History   Substance and Sexual Activity  Drug Use Yes  . Types: Marijuana    Social History   Socioeconomic History  . Marital status: Single    Spouse name: Not on file  . Number of children: Not on file  . Years of education: Not on file  . Highest education level: Not on file  Occupational History  . Not on file  Tobacco Use  . Smoking status: Current Every Day Smoker    Packs/day: 0.00    Types: Cigars  . Smokeless tobacco: Never Used  . Tobacco comment: Smokes Black and Mild - 4-5 per day  Substance and Sexual Activity  . Alcohol use: Yes    Comment: drinks most days-1 case of beer or 2 fifths of liquor  . Drug use: Yes    Types: Marijuana  . Sexual activity: Yes  Other Topics Concern  . Not on file  Social History Narrative   Pt lost his apartment due to being sick with COVID for a month and not being able to work. Pt has been living with various male friends on their couches. One male friend kicked him out and he was living in another friend's car last night.   Social Determinants of Health   Financial Resource Strain:   . Difficulty of Paying Living Expenses: Not on file  Food Insecurity:   . Worried About Charity fundraiser in the Last Year: Not on file  . Ran Out of Food in the Last Year: Not on file  Transportation Needs:   . Lack of Transportation (Medical): Not on file  . Lack of Transportation (Non-Medical): Not on file  Physical Activity:   . Days of Exercise per Week: Not  on file  . Minutes of Exercise per Session: Not on file  Stress:   . Feeling of Stress : Not on file  Social Connections:   . Frequency of Communication with Friends and Family: Not on file  . Frequency of Social Gatherings with Friends and Family: Not on file  . Attends Religious Services: Not on file  . Active Member of Clubs or Organizations: Not on file  . Attends Archivist Meetings: Not on file  . Marital Status: Not on file   Additional  Social History:   Sleep: Fair  Appetite:  Good  Current Medications: Current Facility-Administered Medications  Medication Dose Route Frequency Provider Last Rate Last Admin  . acetaminophen (TYLENOL) tablet 650 mg  650 mg Oral Q6H PRN Lindon Romp A, NP      . FLUoxetine (PROZAC) capsule 10 mg  10 mg Oral Daily Connye Burkitt, NP   10 mg at 05/01/19 7371  . hydrOXYzine (ATARAX/VISTARIL) tablet 25 mg  25 mg Oral TID PRN Rozetta Nunnery, NP      . LORazepam (ATIVAN) tablet 1 mg  1 mg Oral Q6H PRN Connye Burkitt, NP      . OLANZapine zydis (ZYPREXA) disintegrating tablet 10 mg  10 mg Oral Q8H PRN Qunisha Bryk, Myer Peer, MD       And  . LORazepam (ATIVAN) tablet 1 mg  1 mg Oral PRN Dearia Wilmouth, Myer Peer, MD       And  . ziprasidone (GEODON) injection 20 mg  20 mg Intramuscular PRN Hollin Crewe, Myer Peer, MD      . multivitamin with minerals tablet 1 tablet  1 tablet Oral Daily Connye Burkitt, NP   1 tablet at 05/01/19 [id number]  . nicotine (NICODERM CQ - dosed in mg/24 hours) patch 21 mg  21 mg Transdermal Daily Katrina Brosh, Myer Peer, MD   21 mg at 05/01/19 9485  . thiamine tablet 100 mg  100 mg Oral Daily Connye Burkitt, NP   100 mg at 05/01/19 4627  . traZODone (DESYREL) tablet 50 mg  50 mg Oral QHS PRN Rozetta Nunnery, NP        Lab Results:  Results for orders placed or performed during the hospital encounter of 04/30/19 (from the past 48 hour(s))  Hemoglobin A1c     Status: None   Collection Time: 04/30/19  6:41 AM  Result Value Ref Range   Hgb A1c MFr Bld 5.3 4.8 - 5.6 %    Comment: (NOTE) Pre diabetes:          5.7%-6.4% Diabetes:              >6.4% Glycemic control for   <7.0% adults with diabetes    Mean Plasma Glucose 105.41 mg/dL    Comment: Performed at [facility], [place] [address] [place], [place] [address]  Lipid panel     Status: None   Collection Time: 04/30/19  6:41 AM  Result Value Ref Range   Cholesterol 121 0 - 200 mg/dL   Triglycerides 86 <150 mg/dL   HDL 46 >40 mg/dL    Total CHOL/HDL Ratio 2.6 RATIO   VLDL 17 0 - 40 mg/dL   LDL Cholesterol 58 0 - 99 mg/dL    Comment:        Total Cholesterol/HDL:CHD Risk Coronary Heart Disease Risk Table                     Men  Women  1/2 Average Risk   3.4   3.3  Average Risk       5.0   4.4  2 X Average Risk   9.6   7.1  3 X Average Risk  23.4   11.0        Use the calculated Patient Ratio above and the CHD Risk Table to determine the patient's CHD Risk.        ATP III CLASSIFICATION (LDL):  <100     mg/dL   Optimal  100-129  mg/dL   Near or Above                    Optimal  130-159  mg/dL   Borderline  160-189  mg/dL   High  >190     mg/dL   Very High Performed at [facility] [address] [place], [place] [address]   TSH     Status: None   Collection Time: 04/30/19  6:41 AM  Result Value Ref Range   TSH 1.483 0.350 - 4.500 uIU/mL    Comment: Performed by a 3rd Generation assay with a functional sensitivity of <=0.01 uIU/mL. Performed at [facility], [place] [address] [place], [place] [address]     Blood Alcohol level:  Lab Results  Component Value Date   ETH 54 (H) 04/29/2019   ETH <10 53/61/4431    Metabolic Disorder Labs: Lab Results  Component Value Date   HGBA1C 5.3 04/30/2019   MPG 105.41 04/30/2019   No results found for: PROLACTIN Lab Results  Component Value Date   CHOL 121 04/30/2019   TRIG 86 04/30/2019   HDL 46 04/30/2019   CHOLHDL 2.6 04/30/2019   VLDL 17 04/30/2019   LDLCALC 58 04/30/2019    Physical Findings: AIMS: Facial and Oral Movements Muscles of Facial Expression: None, normal Lips and Perioral Area: None, normal Jaw: None, normal Tongue: None, normal,Extremity Movements Upper (arms, wrists, hands, fingers): None, normal Lower (legs, knees, ankles, toes): None, normal, Trunk Movements Neck, shoulders, hips: None, normal, Overall Severity Severity of abnormal movements (highest score from questions above): None,  normal Incapacitation due to abnormal movements: None, normal Patient's awareness of abnormal movements (rate only patient's report): No Awareness, Dental Status Current problems with teeth and/or dentures?: No Does patient usually wear dentures?: No  CIWA:  CIWA-Ar Total: 0 COWS:     Musculoskeletal: Strength & Muscle Tone: within normal limits no tremors, no diaphoresis, no psychomotor restlessness or agitation Gait & Station: normal Patient leans: N/A  Psychiatric Specialty Exam: Physical Exam  Review of Systems no headache, no chest pain, no shortness of breath, no vomiting   Blood pressure (!) 114/46, pulse 67, temperature 98.1 F (36.7 C), resp. rate 18, height 5' 7"  (1.702 m), weight 68 kg, SpO2 99 %.Body mass index is 23.49 kg/m.  General Appearance: improving grooming   Eye Contact:  Good  Speech:  Normal Rate  Volume:  Normal  Mood:  partial improvement   Affect:  mildly anxious, constricted but reactive and tends to improve during session  Thought Process:  Linear and Descriptions of Associations: Intact  Orientation:  Other:  fully alert and attentive  Thought Content:  no hallucinations, no delusions, not internally preoccupied   Suicidal Thoughts:  No currently denies suicidal or self injurious ideations, denies homicidal or violent ideations  Homicidal Thoughts:  No  Memory:  recent and remote grossly intact   Judgement:  Other:  improving  Insight:  fair/improving  Psychomotor Activity:  Normal no symptoms of alcohol WDL noted, appears calm and in no acute distress or discomfort   Concentration:  Concentration: Good and Attention Span: Good  Recall:  Good  Fund of Knowledge:  Good  Language:  Good  Akathisia:  Negative  Handed:  Right  AIMS (if indicated):     Assets:  Communication Skills Desire for Improvement Resilience  ADL's:  Intact  Cognition:  WNL  Sleep:  Number of Hours: 3.5   Assessment -  24 year old male. Presented to hospital voluntarily  for worsening depression , neuro-vegetative symptoms of depression, and suicide attempt by attempting to ingest engine fluid. History of alcohol use disorder, reports he has been drinking up to a fifth of liquor daily , admission BAL 54. Reports long history of depression and intermittent suicidal ideations dating back to childhood.  Today patient reports some improvement, but endorses some persistent depression, anxiety . Denies suicidal ideations and contracts for safety at present. He is not presenting with significant alcohol WDL symptoms and vitals are stable . He reports significant alcohol cravings . We discussed options and is in agreement with Campral trial. Side effects reviewed. At this time he is not suicidal and presents future oriented, has expressed interest in referral to an [facility] on discharge  Treatment Plan Summary: Daily contact with patient to assess and evaluate symptoms and progress in treatment, Medication management, Plan inpatient treatment and medications as below Encourage group and milieu participation to work on coping skills and symptom reduction Encourage efforts to work on sobriety and relapse prevention Continue Ativan PRN for alcohol WDL symptoms as needed , continue Thiamine and Folate Continue Vistaril 25 mgrs Q 8 hours for anxiety as needed  Continue Trazodone 50 mgrs QHS PRN for insomnia as needed  Increase Prozac to 20 mgrs QDAY for depression, anxiety Start Campral 666 mgrs TID for alcohol cravings Continue Nicoderm patch for Nicotine cravings Treatment team working on disposition planning options Jenne Campus, MD 05/01/2019, 12:33 PM

## 2019-05-01 NOTE — Progress Notes (Signed)
   05/01/19 0030  Psych Admission Type (Psych Patients Only)  Admission Status Voluntary  Psychosocial Assessment  Patient Complaints Anxiety;Depression  Eye Contact Fair  Facial Expression Animated  Affect Appropriate to circumstance  Speech Logical/coherent  Interaction Assertive  Appearance/Hygiene Unremarkable  Mood Anxious;Depressed  Thought Process  Coherency WDL  Content WDL  Delusions WDL  Perception WDL  Hallucination None reported or observed  Judgment WDL  Confusion WDL  Danger to Self  Current suicidal ideation? Denies  Danger to Others  Danger to Others None reported or observed  Patient cooperative with getting their EKG tonight. Patient reports mood much better than yesterday. No active SI at present.

## 2019-05-01 NOTE — Progress Notes (Signed)
D. Pt presents as friendly- smiles during interactions- visible in the dayroom interacting appropriately with peers. Per pt's self inventory, pt rated his depression, hopelessness and anxiety a 5/5/3, respectively. Pt writes that his goal today is "talk about the root of my problem".Pt currently denies SI/HI and AVH  A. Labs and vitals monitored. Pt compliant with medications. Pt supported emotionally and encouraged to express concerns and ask questions.   R. Pt remains safe with 15 minute checks. Will continue POC.

## 2019-05-01 NOTE — Progress Notes (Signed)
   05/01/19 2332  Psych Admission Type (Psych Patients Only)  Admission Status Voluntary  Psychosocial Assessment  Patient Complaints Depression  Eye Contact Fair  Facial Expression Flat  Affect Depressed  Speech Logical/coherent  Interaction Assertive  Motor Activity Other (Comment) (WNL)  Appearance/Hygiene Unremarkable  Behavior Characteristics Cooperative  Mood Depressed;Anxious  Thought Process  Coherency WDL  Content WDL  Delusions WDL  Perception WDL  Hallucination None reported or observed  Judgment WDL  Confusion None  Danger to Self  Current suicidal ideation? Denies  Self-Injurious Behavior No self-injurious ideation or behavior indicators observed or expressed   Agreement Not to Harm Self Yes  Description of Agreement verbal  Danger to Others  Danger to Others None reported or observed   Pt is active in milieu in dayroom. Pt flat affect and depressed mood. Spoke with father today who won't allow him to come live with him after discharge. Discussed with pt his feelings about that conversation. Pt encouraged to take advantage of other resources for appropriate housing at discharge. Pt denies SI, HI, AVH and pain. Pt contracts for safety.

## 2019-05-01 NOTE — Progress Notes (Signed)
Recreation Therapy Notes  Date:  1.20.21 Time: 0930 Location: [address] Dayroom  Group Topic: Stress Management  Goal Area(s) Addresses:  Patient will identify positive stress management techniques. Patient will identify benefits of using stress management post d/c.  Behavioral Response:  Engaged  Intervention: Stress Management  Activity :  Progressive Muscle Relaxation.  LRT introduced the stress management technique of progressive muscle relaxation.  LRT read a script that guided patients through each individual muscle group to tense them and then relax them.  Patients were to listen to follow along as LRT read script.  Education:  Stress Management, Discharge Planning.   Education Outcome: Acknowledges Education  Clinical Observations/Feedback: Pt attended and participated in activity.    Steve Turner, LRT/CTRS         Steve Turner A 05/01/2019 10:30 AM 

## 2019-05-02 NOTE — Progress Notes (Signed)
   05/02/19 2150  Psych Admission Type (Psych Patients Only)  Admission Status Voluntary  Psychosocial Assessment  Patient Complaints Anxiety  Eye Contact Fair  Facial Expression Anxious;Worried  Affect Anxious;Depressed  Surveyor, minerals Activity Other (Comment) (WNL)  Appearance/Hygiene Unremarkable  Behavior Characteristics Cooperative  Mood Pleasant;Anxious  Aggressive Behavior  Effect No apparent injury  Thought Process  Coherency WDL  Content WDL  Delusions None reported or observed  Perception WDL  Hallucination None reported or observed  Judgment WDL  Confusion None  Danger to Self  Current suicidal ideation? Denies  Agreement Not to Harm Self Yes  Description of Agreement verbal  Danger to Others  Danger to Others None reported or observed   Pt in dayroom interacting. Pt denies SI, HI, AVH and pain. Pt has gained insight into his situation. Says a "male friend" is one of the reasons he is here. A friend told him that she has been calling his cell phone since he has been here. He realizes that her issues are going to complicate his recovery and his plans for moving forward and that he has to let her go. He says it is "bittersweet." He knows that he has to keep himself well in order to progress. Pt commended for his insight. Pt contracts for safety.

## 2019-05-02 NOTE — Plan of Care (Signed)
Progress note  D: pt found in the dayroom interacting with peers; compliant with medication administration. Pt states they slept fair. Pt rates their depression/hopelessness/anxiety a 1/0/3 out of 10 respectively. Pt states their goal for today is to work on their mental health. Pt states they will achieve this by going to groups. Pt denies any physical complaints or pain. Pt denies si/hi/ah/vh and verbally agrees to approach staff if these become apparent or before harming themself/others while at [facility].  A: Pt provided support and encouragement. Pt given medication per protocol and standing orders. Q59m safety checks implemented and continued.  R: Pt safe on the unit. Will continue to monitor.  Pt progressing in the following metrics  Problem: Education: Goal: Emotional status will improve Outcome: Progressing Goal: Mental status will improve Outcome: Progressing Goal: Verbalization of understanding the information provided will improve Outcome: Progressing

## 2019-05-02 NOTE — Progress Notes (Signed)
Patient requested [employer] resources. CSW provided those resources in addition to alternative homeless resources.   CSW will continue to follow for an appropriate discharge.    Baldo Daub, MSW, LCSW Clinical Social Worker [facility]  Phone: [phone]
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
## 2019-05-02 NOTE — Progress Notes (Signed)
[facility] MD Progress Note  05/02/2019 12:36 PM Steve Turner  MRN:  [id number] Subjective: Patient states he is feeling better today.  Alcohol cravings have decreased/improved.  Mood is described as improving.  Currently denies medication side effects. Objective : I have discussed case with treatment team and have met with patient. 24 year old male. Presented to hospital voluntarily for worsening depression , neuro-vegetative symptoms of depression, and suicide attempt by attempting to ingest engine fluid. History of alcohol use disorder, reports he has been drinking up to a fifth of liquor daily , admission BAL 54. Reports long history of depression and intermittent suicidal ideations dating back to childhood.  Today patient presents with improving mood and a full range of affect.  Staff notes/report indicate that he still presents with a flat/subdued affect at times.  He had reported significant alcohol cravings yesterday.  Today these are improved. He is not currently presenting with symptoms of withdrawal.  No tremors, no diaphoresis, no restlessness or agitation.  Vitals are currently stable (117/74, pulse 60) Patient recently found out he will not be able to stay with his father after discharge.  He is future oriented and is interested in going to an [employer] setting. No disruptive or agitated behaviors on unit. Denies suicidal ideations. Tolerating medications well thus far.    Principal Problem: Alcohol Use Disorder, Depression  Diagnosis: Active Problems:   Severe major depression, single episode, without psychotic features ([place])   Alcohol dependence with alcohol-induced mood disorder ([facility])  Total Time spent with patient: 15 minutes  Past Psychiatric History:   Past Medical History: History reviewed. No pertinent past medical history. History reviewed. No pertinent surgical history. Family History: History reviewed. No pertinent family history. Family Psychiatric  History:  Social  History:  Social History   Substance and Sexual Activity  Alcohol Use Yes   Comment: drinks most days-1 case of beer or 2 fifths of liquor     Social History   Substance and Sexual Activity  Drug Use Yes  . Types: Marijuana    Social History   Socioeconomic History  . Marital status: Single    Spouse name: Not on file  . Number of children: Not on file  . Years of education: Not on file  . Highest education level: Not on file  Occupational History  . Not on file  Tobacco Use  . Smoking status: Current Every Day Smoker    Packs/day: 0.00    Types: Cigars  . Smokeless tobacco: Never Used  . Tobacco comment: Smokes Black and Mild - 4-5 per day  Substance and Sexual Activity  . Alcohol use: Yes    Comment: drinks most days-1 case of beer or 2 fifths of liquor  . Drug use: Yes    Types: Marijuana  . Sexual activity: Yes  Other Topics Concern  . Not on file  Social History Narrative   Pt lost his apartment due to being sick with COVID for a month and not being able to work. Pt has been living with various male friends on their couches. One male friend kicked him out and he was living in another friend's car last night.   Social Determinants of Health   Financial Resource Strain:   . Difficulty of Paying Living Expenses: Not on file  Food Insecurity:   . Worried About Charity fundraiser in the Last Year: Not on file  . Ran Out of Food in the Last Year: Not on file  Transportation  Needs:   . Lack of Transportation (Medical): Not on file  . Lack of Transportation (Non-Medical): Not on file  Physical Activity:   . Days of Exercise per Week: Not on file  . Minutes of Exercise per Session: Not on file  Stress:   . Feeling of Stress : Not on file  Social Connections:   . Frequency of Communication with Friends and Family: Not on file  . Frequency of Social Gatherings with Friends and Family: Not on file  . Attends Religious Services: Not on file  . Active Member of  Clubs or Organizations: Not on file  . Attends Archivist Meetings: Not on file  . Marital Status: Not on file   Additional Social History:   Sleep: Improving  Appetite:  Good  Current Medications: Current Facility-Administered Medications  Medication Dose Route Frequency Provider Last Rate Last Admin  . acamprosate (CAMPRAL) tablet 666 mg  666 mg Oral TID WC Peytin Dechert, Myer Peer, MD   666 mg at 05/02/19 1137  . acetaminophen (TYLENOL) tablet 650 mg  650 mg Oral Q6H PRN Lindon Romp A, NP      . FLUoxetine (PROZAC) capsule 20 mg  20 mg Oral Daily Shawana Knoch, Myer Peer, MD   20 mg at 05/02/19 0750  . hydrOXYzine (ATARAX/VISTARIL) tablet 25 mg  25 mg Oral TID PRN Lindon Romp A, NP      . LORazepam (ATIVAN) tablet 1 mg  1 mg Oral Q6H PRN Connye Burkitt, NP      . OLANZapine zydis (ZYPREXA) disintegrating tablet 10 mg  10 mg Oral Q8H PRN Shonte Soderlund, Myer Peer, MD       And  . LORazepam (ATIVAN) tablet 1 mg  1 mg Oral PRN Markelle Najarian, Myer Peer, MD       And  . ziprasidone (GEODON) injection 20 mg  20 mg Intramuscular PRN Laurinda Carreno, Myer Peer, MD      . multivitamin with minerals tablet 1 tablet  1 tablet Oral Daily Connye Burkitt, NP   1 tablet at 05/02/19 0750  . nicotine (NICODERM CQ - dosed in mg/24 hours) patch 21 mg  21 mg Transdermal Daily Liani Caris, Myer Peer, MD   21 mg at 05/02/19 0750  . thiamine tablet 100 mg  100 mg Oral Daily Connye Burkitt, NP   100 mg at 05/02/19 0750  . traZODone (DESYREL) tablet 50 mg  50 mg Oral QHS PRN Rozetta Nunnery, NP        Lab Results:  No results found for this or any previous visit (from the past 48 hour(s)).  Blood Alcohol level:  Lab Results  Component Value Date   ETH 54 (H) 04/29/2019   ETH <10 38/75/6433    Metabolic Disorder Labs: Lab Results  Component Value Date   HGBA1C 5.3 04/30/2019   MPG 105.41 04/30/2019   No results found for: PROLACTIN Lab Results  Component Value Date   CHOL 121 04/30/2019   TRIG 86 04/30/2019   HDL 46  04/30/2019   CHOLHDL 2.6 04/30/2019   VLDL 17 04/30/2019   LDLCALC 58 04/30/2019    Physical Findings: AIMS: Facial and Oral Movements Muscles of Facial Expression: None, normal Lips and Perioral Area: None, normal Jaw: None, normal Tongue: None, normal,Extremity Movements Upper (arms, wrists, hands, fingers): None, normal Lower (legs, knees, ankles, toes): None, normal, Trunk Movements Neck, shoulders, hips: None, normal, Overall Severity Severity of abnormal movements (highest score from questions above): None, normal Incapacitation due to  abnormal movements: None, normal Patient's awareness of abnormal movements (rate only patient's report): No Awareness, Dental Status Current problems with teeth and/or dentures?: No Does patient usually wear dentures?: No  CIWA:  CIWA-Ar Total: 0 COWS:     Musculoskeletal: Strength & Muscle Tone: within normal limits no tremors, no diaphoresis, no psychomotor restlessness or agitation Gait & Station: normal Patient leans: N/A  Psychiatric Specialty Exam: Physical Exam  Review of Systems no headache, no chest pain, no shortness of breath, no vomiting   Blood pressure 117/74, pulse 60, temperature 98.1 F (36.7 C), resp. rate 18, height _0  (1.702 m), weight 68 kg, SpO2 99 %.Body mass index is 23.49 kg/m.  General Appearance: improving grooming   Eye Contact:  Good  Speech:  Normal Rate  Volume:  Normal  Mood:  Feeling better than on admission, presents with improved mood today  Affect:  Currently less constricted and less anxious in affect, smiles at times appropriately during session  Thought Process:  Linear and Descriptions of Associations: Intact  Orientation:  Other:  fully alert and attentive  Thought Content:  no hallucinations, no delusions, not internally preoccupied   Suicidal Thoughts:  No currently denies suicidal or self injurious ideations, denies homicidal or violent ideations  Homicidal Thoughts:  No  Memory:  recent  and remote grossly intact   Judgement:  Other:  improving   Insight:  fair/improving  Psychomotor Activity:  Normal no symptoms of alcohol WDL noted, appears calm and in no acute distress or discomfort   Concentration:  Concentration: Good and Attention Span: Good  Recall:  Good  Fund of Knowledge:  Good  Language:  Good  Akathisia:  Negative  Handed:  Right  AIMS (if indicated):     Assets:  Communication Skills Desire for Improvement Resilience  ADL's:  Intact  Cognition:  WNL  Sleep:  Number of Hours: 6   Assessment -  24 year old male. Presented to hospital voluntarily for worsening depression , neuro-vegetative symptoms of depression, and suicide attempt by attempting to ingest engine fluid. History of alcohol use disorder, reports he has been drinking up to a fifth of liquor daily , admission BAL 54. Reports long history of depression and intermittent suicidal ideations dating back to childhood.  Patient describes feeling better today and does present at this time with improved range of affect.  Staff notes/report indicate he still has presented with a subdued/flat affect at times.  Currently denies suicidal ideations, is future oriented, and interested in going to an [employer] following discharge.  He is currently tolerating medications well.  No current symptoms of alcohol withdrawal (no tremors, no diaphoresis, no restlessness, stable vitals).  Tolerating Campral trial well thus far and today describes improved alcohol cravings  Treatment Plan Summary: Daily contact with patient to assess and evaluate symptoms and progress in treatment, Medication management, Plan inpatient treatment and medications as below  Treatment plan reviewed as below today 1/21 Encourage group and milieu participation to work on coping skills and symptom reduction Encourage efforts to work on sobriety and relapse prevention Continue Ativan PRN for alcohol WDL symptoms as needed  Continue Vistaril 25  mgrs Q 8 hours for anxiety as needed  Continue Trazodone 50 mgrs QHS PRN for insomnia as needed  Continue Prozac  20 mgrs QDAY for depression, anxiety Continue Campral 666 mgrs TID for alcohol cravings Continue Nicoderm patch for Nicotine cravings Treatment team working on disposition planning options-see above Jenne Campus, MD 05/02/2019, 12:36 PM  Patient ID: Steve Turner, male   DOB: October 01, 1995, 24 y.o.   MRN: [id number]

## 2019-05-03 NOTE — Progress Notes (Signed)
D:  No signs or symptoms of ETOH withdrawals were noted this shift.  Pt denied SI/HI/AVH.  A: Emotional support and encouragement provided. RN monitored pt's Vitals, labs and medication side effects. Medications administered per MD orders. Maintained q 15 minute safety checks.  R: Pt reported no adverse reactions to medications.  Pt interacted well with others on the unit.  RN will continue to provide support and assist pt as needed.

## 2019-05-03 NOTE — Progress Notes (Signed)
[facility] MD Progress Note  05/03/2019 12:44 PM Muath Hallam  MRN:  [id number] Subjective: Reports partial improvement. Craving shave subsided. Mood is described as " getting better". Denies suicidal ideations. Objective : I have discussed case with treatment team and have met with patient. 24 year old male. Presented to hospital voluntarily for worsening depression , neuro-vegetative symptoms of depression, and suicide attempt by attempting to ingest engine fluid. History of alcohol use disorder, reports he has been drinking up to a fifth of liquor daily , admission BAL 54. Reports long history of depression and intermittent suicidal ideations dating back to childhood.  Patient presents with gradual improvement compared to admission presentation and is presenting with improving mood and range of affect . Denies suicidal ideations.  He is not presenting with symptoms of alcohol WDL , and alcohol cravings, which he had described as intense , have fortunately subsided.  Behavior on unit is calm and in good control. Pleasant on approach. Denies medication side effects. Patient has spoken about how relationship stressors contributed to decompensation and is stating he intends to focus on self and recovery /abstinence at discharge.    Principal Problem: Alcohol Use Disorder, Depression  Diagnosis: Active Problems:   Severe major depression, single episode, without psychotic features ([place])   Alcohol dependence with alcohol-induced mood disorder ([facility]CC)  Total Time spent with patient: 15 minutes  Past Psychiatric History:   Past Medical History: History reviewed. No pertinent past medical history. History reviewed. No pertinent surgical history. Family History: History reviewed. No pertinent family history. Family Psychiatric  History:  Social History:  Social History   Substance and Sexual Activity  Alcohol Use Yes   Comment: drinks most days-1 case of beer or 2 fifths of liquor     Social History    Substance and Sexual Activity  Drug Use Yes  . Types: Marijuana    Social History   Socioeconomic History  . Marital status: Single    Spouse name: Not on file  . Number of children: Not on file  . Years of education: Not on file  . Highest education level: Not on file  Occupational History  . Not on file  Tobacco Use  . Smoking status: Current Every Day Smoker    Packs/day: 0.00    Types: Cigars  . Smokeless tobacco: Never Used  . Tobacco comment: Smokes Black and Mild - 4-5 per day  Substance and Sexual Activity  . Alcohol use: Yes    Comment: drinks most days-1 case of beer or 2 fifths of liquor  . Drug use: Yes    Types: Marijuana  . Sexual activity: Yes  Other Topics Concern  . Not on file  Social History Narrative   Pt lost his apartment due to being sick with COVID for a month and not being able to work. Pt has been living with various male friends on their couches. One male friend kicked him out and he was living in another friend's car last night.   Social Determinants of Health   Financial Resource Strain:   . Difficulty of Paying Living Expenses: Not on file  Food Insecurity:   . Worried About Charity fundraiser in the Last Year: Not on file  . Ran Out of Food in the Last Year: Not on file  Transportation Needs:   . Lack of Transportation (Medical): Not on file  . Lack of Transportation (Non-Medical): Not on file  Physical Activity:   . Days of Exercise per Week:  Not on file  . Minutes of Exercise per Session: Not on file  Stress:   . Feeling of Stress : Not on file  Social Connections:   . Frequency of Communication with Friends and Family: Not on file  . Frequency of Social Gatherings with Friends and Family: Not on file  . Attends Religious Services: Not on file  . Active Member of Clubs or Organizations: Not on file  . Attends Archivist Meetings: Not on file  . Marital Status: Not on file   Additional Social History:   Sleep:  Improving  Appetite:  Good  Current Medications: Current Facility-Administered Medications  Medication Dose Route Frequency Provider Last Rate Last Admin  . acamprosate (CAMPRAL) tablet 666 mg  666 mg Oral TID WC Tavares Levinson, Myer Peer, MD   666 mg at 05/03/19 1206  . acetaminophen (TYLENOL) tablet 650 mg  650 mg Oral Q6H PRN Lindon Romp A, NP      . FLUoxetine (PROZAC) capsule 20 mg  20 mg Oral Daily Sky Primo, Myer Peer, MD   20 mg at 05/03/19 0801  . hydrOXYzine (ATARAX/VISTARIL) tablet 25 mg  25 mg Oral TID PRN Lindon Romp A, NP   25 mg at 05/02/19 1613  . OLANZapine zydis (ZYPREXA) disintegrating tablet 10 mg  10 mg Oral Q8H PRN Reylene Stauder, Myer Peer, MD       And  . LORazepam (ATIVAN) tablet 1 mg  1 mg Oral PRN Allyce Bochicchio, Myer Peer, MD       And  . ziprasidone (GEODON) injection 20 mg  20 mg Intramuscular PRN Samar Venneman, Myer Peer, MD      . multivitamin with minerals tablet 1 tablet  1 tablet Oral Daily Connye Burkitt, NP   1 tablet at 05/03/19 0801  . nicotine (NICODERM CQ - dosed in mg/24 hours) patch 21 mg  21 mg Transdermal Daily Dakarai Mcglocklin, Myer Peer, MD   21 mg at 05/03/19 0800  . thiamine tablet 100 mg  100 mg Oral Daily Connye Burkitt, NP   100 mg at 05/03/19 0800  . traZODone (DESYREL) tablet 50 mg  50 mg Oral QHS PRN Rozetta Nunnery, NP        Lab Results:  No results found for this or any previous visit (from the past 48 hour(s)).  Blood Alcohol level:  Lab Results  Component Value Date   ETH 54 (H) 04/29/2019   ETH <10 13/24/4010    Metabolic Disorder Labs: Lab Results  Component Value Date   HGBA1C 5.3 04/30/2019   MPG 105.41 04/30/2019   No results found for: PROLACTIN Lab Results  Component Value Date   CHOL 121 04/30/2019   TRIG 86 04/30/2019   HDL 46 04/30/2019   CHOLHDL 2.6 04/30/2019   VLDL 17 04/30/2019   LDLCALC 58 04/30/2019    Physical Findings: AIMS: Facial and Oral Movements Muscles of Facial Expression: None, normal Lips and Perioral Area: None,  normal Jaw: None, normal Tongue: None, normal,Extremity Movements Upper (arms, wrists, hands, fingers): None, normal Lower (legs, knees, ankles, toes): None, normal, Trunk Movements Neck, shoulders, hips: None, normal, Overall Severity Severity of abnormal movements (highest score from questions above): None, normal Incapacitation due to abnormal movements: None, normal Patient's awareness of abnormal movements (rate only patient's report): No Awareness, Dental Status Current problems with teeth and/or dentures?: No Does patient usually wear dentures?: No  CIWA:  CIWA-Ar Total: 0 COWS:     Musculoskeletal: Strength & Muscle Tone: within normal limits  no tremors, no diaphoresis, no psychomotor restlessness or agitation Gait & Station: normal Patient leans: N/A  Psychiatric Specialty Exam: Physical Exam  Review of Systems no headache, no chest pain, no shortness of breath, no vomiting   Blood pressure 112/61, pulse 72, temperature (!) 97.4 F (36.3 C), resp. rate 16, height 5' 7"  (1.702 m), weight 68 kg, SpO2 100 %.Body mass index is 23.49 kg/m.  General Appearance: Well Groomed  Eye Contact:  Good  Speech:  Normal Rate  Volume:  Normal  Mood:  improving   Affect:  fuller in range  Thought Process:  Linear and Descriptions of Associations: Intact  Orientation:  Other:  fully alert and attentive  Thought Content:  no hallucinations, no delusions, not internally preoccupied   Suicidal Thoughts:  No currently denies suicidal or self injurious ideations, denies homicidal or violent ideations  Homicidal Thoughts:  No  Memory:  recent and remote grossly intact   Judgement:  Other:  improving   Insight:  improving  Psychomotor Activity:  Normal no symptoms of alcohol WDL noted, appears calm and in no acute distress or discomfort   Concentration:  Concentration: Good and Attention Span: Good  Recall:  Good  Fund of Knowledge:  Good  Language:  Good  Akathisia:  Negative  Handed:   Right  AIMS (if indicated):     Assets:  Communication Skills Desire for Improvement Resilience  ADL's:  Intact  Cognition:  WNL  Sleep:  Number of Hours: 6.75   Assessment -  24 year old male. Presented to hospital voluntarily for worsening depression , neuro-vegetative symptoms of depression, and suicide attempt by attempting to ingest engine fluid. History of alcohol use disorder, reports he has been drinking up to a fifth of liquor daily , admission BAL 54. Reports long history of depression and intermittent suicidal ideations dating back to childhood.  At this time patient is presenting with gradually improving mood and today affect is fuller in range/brighter. He is not currently presenting with significant symptoms of alcohol WDL and vitals are stable. He is denying medication side effects and states Campral has helped in decreasing alcohol cravings. He is future oriented at this time and looking into disposition planning options, plans to go to an [employer] at discharge. Reports he had positive phone  conversation with Mr. Darene Lamer. Erlinda Hong from [place] and is optimistic about being accepted there.    Treatment Plan Summary: Daily contact with patient to assess and evaluate symptoms and progress in treatment, Medication management, Plan inpatient treatment and medications as below  Treatment plan reviewed as below today 1/22 Encourage group and milieu participation to work on coping skills and symptom reduction Encourage efforts to work on sobriety and relapse prevention Continue Ativan PRN for alcohol WDL symptoms as needed  Continue Vistaril 25 mgrs Q 8 hours for anxiety as needed  Continue Trazodone 50 mgrs QHS PRN for insomnia as needed  Continue Prozac  20 mgrs QDAY for depression, anxiety Continue Campral 666 mgrs TID for alcohol cravings Continue Nicoderm patch for Nicotine cravings Treatment team working on disposition planning options - plans to go to [place], MD 05/03/2019, 12:44 PM   Patient ID: Newell Coral, male   DOB: 1995-11-10, 24 y.o.   MRN: [id number]

## 2019-05-03 NOTE — Progress Notes (Signed)
   05/03/19 2030  COVID-19 Daily Checkoff  Have you had a fever (temp > 37.80C/100F)  in the past 24 hours?  No  If you have had runny nose, nasal congestion, sneezing in the past 24 hours, has it worsened? No  COVID-19 EXPOSURE  Have you traveled outside the state in the past 14 days? No  Have you been in contact with someone with a confirmed diagnosis of COVID-19 or PUI in the past 14 days without wearing appropriate PPE? No  Have you been living in the same home as a person with confirmed diagnosis of COVID-19 or a PUI (household contact)? No  Have you been diagnosed with COVID-19? No

## 2019-05-03 NOTE — Progress Notes (Signed)
Patient observed up in the dayroom watching tv and interacting with peers appropriately. He reports having had a good day and is looking forward to discharge on tomorrow. He request no medications for sleep.Safety maintained on unit with 15 min checks.

## 2019-05-03 NOTE — Progress Notes (Signed)
Recreation Therapy Notes  Date:  1.22.21 Time: 0930 Location: [address] Group Room  Group Topic: Stress Management  Goal Area(s) Addresses:  Patient will identify positive stress management techniques. Patient will identify benefits of using stress management post d/c.  Behavioral Response: Engaged  Intervention: Stress Management  Activity : Guided Imagery.  LRT read script that guided patients on a calming retreat on the beach to enjoy the peaceful waves.  Patients were to listen as script was read to engage in activity.  Education:  Stress Management, Discharge Planning.   Education Outcome: Acknowledges Education  Clinical Observations/Feedback: Pt attended and participated in activity.    Caroll Rancher, LRT/CTRS         Lillia Abed, Millard Bautch A 05/03/2019 10:50 AM

## 2019-05-04 MED ORDER — NICOTINE 21 MG/24HR TD PT24: 21.0000 mg | MEDICATED_PATCH | Freq: Every day | TRANSDERMAL | 0 refills | Status: AC

## 2019-05-04 MED ORDER — ADULT MULTIVITAMIN W/MINERALS CH: 1.0000 | ORAL_TABLET | Freq: Every day | ORAL | Status: AC

## 2019-05-04 MED ORDER — ACAMPROSATE CALCIUM 333 MG PO TBEC: 666.0000 mg | DELAYED_RELEASE_TABLET | Freq: Three times a day (TID) | ORAL | 0 refills | Status: AC

## 2019-05-04 MED ORDER — FLUOXETINE HCL 20 MG PO CAPS: 20.0000 mg | ORAL_CAPSULE | Freq: Every day | ORAL | 0 refills | Status: AC

## 2019-05-04 NOTE — Progress Notes (Signed)
D. Pt is friendly, calm and cooperative, smiles upon approach- per pt's self inventory, pt rated his depression, hopelessness and anxiety all 0's. Pt writes that his most important goal today is discharge, and writes, "I appreciate everything the staff did for me the staff and doctor helped me at my lowest and I thank you".Pt currently denies SI/HI and AVH A. Labs and vitals monitored. Pt given and educated on medications. Pt supported emotionally and encouraged to express concerns and ask questions.   R. Pt remains safe with 15 minute checks. Will continue POC.

## 2019-05-04 NOTE — Progress Notes (Signed)
  [facility] Adult Case Management Discharge Plan :  Will you be returning to the same living situation after discharge:  No.  Going to a [facility] At discharge, do you have transportation home?: Yes,  arranged by patient Do you have the ability to pay for your medications: Yes,  has insurance  Release of information consent forms completed and emailed to Medical Records, then turned in to Medical Records by CSW.   Patient to Follow up at: Follow-up Information    [facility] Follow up on 05/08/2019.   Why: You are scheduled for an appointment on 05/08/19 at 1:00 pm.  This appointment will be held virtually and the provider will call you at your appointment time.  Please have your insurance information and your discharge summary available. Contact information: [address] [place] [address] [phone]           Next level of care provider has access to [facility] Link:no  Safety Planning and Suicide Prevention discussed: Yes,  with father  Have you used any form of tobacco in the last 30 days? (Cigarettes, Smokeless Tobacco, Cigars, and/or Pipes): Yes  Has patient been referred to the Quitline?: Patient refused referral  Patient has been referred for addiction treatment: Yes  Lynnell Chad, LCSW 05/04/2019, 11:12 AM

## 2019-05-04 NOTE — BHH Suicide Risk Assessment (Signed)
[facility] Discharge Suicide Risk Assessment   Principal Problem:  Depression, Alcohol Use Disorder Discharge Diagnoses: Active Problems:   Severe major depression, single episode, without psychotic features (HCC)   Alcohol dependence with alcohol-induced mood disorder (HCC)   Total Time spent with patient: 30 minutes  Musculoskeletal: Strength & Muscle Tone: within normal limits Gait & Station: normal Patient leans: N/A  Psychiatric Specialty Exam: Review of Systems denies headache, no chest pain , no shortness of breath, no nausea or vomiting   Blood pressure 101/65, pulse 75, temperature 97.9 F (36.6 C), temperature source Oral, resp. rate 16, height 5\' 7"  (1.702 m), weight 68 kg, SpO2 100 %.Body mass index is 23.49 kg/m.  General Appearance: improved grooming   Eye Contact::  Good  Speech:  Normal Rate409  Volume:  Normal  Mood:  reports improved mood and currently presents euthymic   Affect:  Appropriate and Full Range  Thought Process:  Linear and Descriptions of Associations: Intact  Orientation:  Full (Time, Place, and Person)  Thought Content:  no hallucinations, no delusions, not internally preoccupied   Suicidal Thoughts:  No currently denies suicidal or self injurious ideations , denies HI  Homicidal Thoughts:  No  Memory:  recent and remote grossly intact  Judgement:  Other:  improving   Insight:  fair/improving   Psychomotor Activity:  Normal- calm , no psychomotor agitation or restlessness   Concentration:  Good  Recall:  Good  Fund of Knowledge:Good  Language: Good  Akathisia:  Negative  Handed:  Right  AIMS (if indicated):     Assets:  Communication Skills Desire for Improvement Resilience  Sleep:  Number of Hours: 6.75  Cognition: WNL  ADL's:  Intact   Mental Status Per Nursing Assessment::   On Admission:  NA  Demographic Factors:  5 y old male   Loss Factors: Unemployment , unstable housing , alcohol use disorder  Historical Factors: History  of depression and of chronic intermittent suicidal ideations. History of alcohol use disorder  Risk Reduction Factors:   Sense of responsibility to family and Positive coping skills or problem solving skills  Continued Clinical Symptoms:  At this time patient presents alert, attentive, well related, calm, pleasant, with improved mood. At this time presents euthymic, with a full range of affect . No thought disorder, no SI or HI, no psychotic features, future oriented . Denies medication side effects, which we have reviewed. Behavior on unit calm and in good control, interacting appropriately with peers/staff   Cognitive Features That Contribute To Risk:  No gross cognitive deficits noted upon discharge. Is alert , attentive, and oriented x 3   Suicide Risk:  Mild:  Suicidal ideation of limited frequency, intensity, duration, and specificity.  There are no identifiable plans, no associated intent, mild dysphoria and related symptoms, good self-control (both objective and subjective assessment), few other risk factors, and identifiable protective factors, including available and accessible social support.  Follow-up Information    [facility] Follow up on 05/08/2019.   Why: You are scheduled for an appointment on 05/08/19 at 1:00 pm.  This appointment will be held virtually and the provider will call you at your appointment time.  Please have your insurance information and your discharge summary available. Contact information: [address] [place] [phone]           Plan Of Care/Follow-up recommendations:  Activity:  as tolerated  Diet:  regular Tests:  NA Other:  See below  Patient expresses readiness for discharge  and is leaving unit in good spirits . Follow up as above . We have reviewed/have recommended 12 step program participation.  Jenne Campus, MD 05/04/2019, 11:50 AM
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
## 2019-05-04 NOTE — Progress Notes (Signed)
Pt discharged to lobby. Pt was stable and appreciative at that time. All papers and prescriptions were given and valuables returned. Verbal understanding expressed. Denies SI/HI and A/VH. Pt given opportunity to express concerns and ask questions.  

## 2019-12-24 ENCOUNTER — Ambulatory Visit: Payer: 59

## 2020-02-15 ENCOUNTER — Emergency Department (HOSPITAL_COMMUNITY)
Admission: EM | Admit: 2020-02-15 | Discharge: 2020-02-15 | Disposition: A | Discharge status: Home / Self Care | Payer: Self-pay | Attending: Emergency Medicine | Admitting: Emergency Medicine

## 2020-02-15 ENCOUNTER — Other Ambulatory Visit: Payer: Self-pay

## 2020-02-15 DIAGNOSIS — Z5321 Procedure and treatment not carried out due to patient leaving prior to being seen by health care provider: Secondary | ICD-10-CM | POA: Insufficient documentation

## 2020-02-24 ENCOUNTER — Emergency Department (HOSPITAL_BASED_OUTPATIENT_CLINIC_OR_DEPARTMENT_OTHER)
Admission: EM | Admit: 2020-02-24 | Discharge: 2020-02-24 | Disposition: A | Discharge status: Home / Self Care | Payer: 59 | Attending: Emergency Medicine | Admitting: Emergency Medicine

## 2020-02-24 ENCOUNTER — Other Ambulatory Visit: Payer: Self-pay

## 2020-02-24 ENCOUNTER — Encounter (HOSPITAL_BASED_OUTPATIENT_CLINIC_OR_DEPARTMENT_OTHER): Payer: Self-pay | Admitting: *Deleted

## 2020-02-24 ENCOUNTER — Emergency Department (HOSPITAL_BASED_OUTPATIENT_CLINIC_OR_DEPARTMENT_OTHER): Payer: 59

## 2020-02-24 DIAGNOSIS — N50812 Left testicular pain: Secondary | ICD-10-CM | POA: Insufficient documentation

## 2020-02-24 DIAGNOSIS — N5089 Other specified disorders of the male genital organs: Secondary | ICD-10-CM

## 2020-02-24 DIAGNOSIS — F17290 Nicotine dependence, other tobacco product, uncomplicated: Secondary | ICD-10-CM | POA: Diagnosis not present

## 2020-02-24 DIAGNOSIS — N453 Epididymo-orchitis: Secondary | ICD-10-CM

## 2020-02-24 LAB — CBC WITH DIFFERENTIAL/PLATELET
Abs Immature Granulocytes: 0.22 10*3/uL — ABNORMAL HIGH (ref 0.00–0.07)
Basophils Absolute: 0.1 10*3/uL (ref 0.0–0.1)
Basophils Relative: 0 %
Eosinophils Absolute: 0 10*3/uL (ref 0.0–0.5)
Eosinophils Relative: 0 %
HCT: 42.3 % (ref 39.0–52.0)
Hemoglobin: 14.2 g/dL (ref 13.0–17.0)
Immature Granulocytes: 1 %
Lymphocytes Relative: 7 %
Lymphs Abs: 1.6 10*3/uL (ref 0.7–4.0)
MCH: 28.4 pg (ref 26.0–34.0)
MCHC: 33.6 g/dL (ref 30.0–36.0)
MCV: 84.6 fL (ref 80.0–100.0)
Monocytes Absolute: 1.9 10*3/uL — ABNORMAL HIGH (ref 0.1–1.0)
Monocytes Relative: 8 %
Neutro Abs: 18.7 10*3/uL — ABNORMAL HIGH (ref 1.7–7.7)
Neutrophils Relative %: 84 %
Platelets: 244 10*3/uL (ref 150–400)
RBC: 5 MIL/uL (ref 4.22–5.81)
RDW: 13.6 % (ref 11.5–15.5)
WBC: 22.4 10*3/uL — ABNORMAL HIGH (ref 4.0–10.5)
nRBC: 0 % (ref 0.0–0.2)

## 2020-02-24 LAB — COMPREHENSIVE METABOLIC PANEL
ALT: 27 U/L (ref 0–44)
AST: 31 U/L (ref 15–41)
Albumin: 3.7 g/dL (ref 3.5–5.0)
Alkaline Phosphatase: 39 U/L (ref 38–126)
Anion gap: 10 (ref 5–15)
BUN: 11 mg/dL (ref 6–20)
CO2: 22 mmol/L (ref 22–32)
Calcium: 9.1 mg/dL (ref 8.9–10.3)
Chloride: 102 mmol/L (ref 98–111)
Creatinine, Ser: 0.87 mg/dL (ref 0.61–1.24)
GFR, Estimated: >60 mL/min (ref >60)
Glucose, Bld: 108 mg/dL — ABNORMAL HIGH (ref 70–99)
Potassium: 3.4 mmol/L — ABNORMAL LOW (ref 3.5–5.1)
Sodium: 134 mmol/L — ABNORMAL LOW (ref 135–145)
Total Bilirubin: 0.6 mg/dL (ref 0.3–1.2)
Total Protein: 7 g/dL (ref 6.5–8.1)

## 2020-02-24 LAB — URINALYSIS, ROUTINE W REFLEX MICROSCOPIC
Bilirubin Urine: NEGATIVE
Glucose, UA: NEGATIVE mg/dL
Hgb urine dipstick: NEGATIVE
Ketones, ur: 15 mg/dL — AB
Leukocytes,Ua: NEGATIVE
Nitrite: NEGATIVE
Protein, ur: NEGATIVE mg/dL
Specific Gravity, Urine: 1.01 (ref 1.005–1.030)
pH: 7.5 (ref 5.0–8.0)

## 2020-02-24 MED ORDER — DOXYCYCLINE HYCLATE 100 MG PO CAPS: 100.0000 mg | ORAL_CAPSULE | Freq: Two times a day (BID) | ORAL | 0 refills | Status: DC

## 2020-02-24 MED ORDER — ACETAMINOPHEN 325 MG PO TABS
650.0000 mg | ORAL_TABLET | Freq: Once | ORAL | Status: AC
  Administered 2020-02-24: 650 mg via ORAL
  Filled 2020-02-24: qty 2

## 2020-02-24 MED ORDER — MORPHINE SULFATE (PF) 4 MG/ML IV SOLN
4.0000 mg | Freq: Once | INTRAVENOUS | Status: AC
  Administered 2020-02-24: 4 mg via INTRAVENOUS
  Filled 2020-02-24 (×2): qty 1

## 2020-02-24 MED ORDER — SODIUM CHLORIDE 0.9 % IV SOLN: INTRAVENOUS | Status: DC | PRN

## 2020-02-24 MED ORDER — HYDROCODONE-ACETAMINOPHEN 5-325 MG PO TABS: 1.0000 | ORAL_TABLET | Freq: Four times a day (QID) | ORAL | 0 refills | Status: AC | PRN

## 2020-02-24 MED ORDER — SODIUM CHLORIDE 0.9 % IV SOLN
1.0000 g | Freq: Once | INTRAVENOUS | Status: AC
  Administered 2020-02-24: 1 g via INTRAVENOUS
  Filled 2020-02-24: qty 10

## 2020-02-24 NOTE — ED Provider Notes (Signed)
Medical screening examination/treatment/procedure(s) were conducted as a shared visit with non-physician practitioner(s) and myself.  I personally evaluated the patient during the encounter. Briefly, the patient is a 24 y.o. male who presents to the ED with fever, left testicle pain.  Patient appears to have epididymitis/orchitis on examination.  There is no crepitus.  No concern for deep space infection including Fournier's gangrene.  Multiple sex partners.  Ultrasound confirms inflammation of the epididymis and testicle.  Possible developing pyocele but no definitive abscess. On exam there is no obvious fluctuance or drainable abscess.  Patient vital signs improved following Tylenol, fluids, antibiotics.  Did have a white count of 22.  Overall he is hemodynamically stable.  He appears well.  No concern for sepsis.  He understands return precautions especially if he develops any worsening swelling, more severe pain or notices any more fluctuance/drainage.  We will have him follow-up closely with urology.    This chart was dictated using voice recognition software.  Despite best efforts to proofread,  errors can occur which can change the documentation meaning.     EKG Interpretation None           Virgina Norfolk, DO 02/24/20 2047

## 2020-02-24 NOTE — Discharge Instructions (Addendum)
Take antibiotics as prescribed.  Take the entire course, even if your symptoms improve. Take ibuprofen 3 times a day with meals as needed for pain and swelling.  Do not take other anti-inflammatories at the same time (Advil, Motrin, naproxen, Aleve). You may supplement with Tylenol if you need further pain control. Use Norco as needed for severe breakthrough pain.  Have caution, this may make you tired or groggy.  Do not drive or operate heavy machinery while taking this medicine. No results for gonorrhea chlamydia are pending.  If positive, you have already been treated and do not need further treatment.  However you'll need to let your partners know.  If negative, you will not receive a phone call.  Either way, you may check online on MyChart. Call the doctor listed below to set up a follow-up appointment for further evaluation and management. Return to the emergency room with any new, worsening, concerning symptoms.

## 2020-02-24 NOTE — ED Triage Notes (Signed)
4 days of swollen left testicle and back pain.

## 2020-02-24 NOTE — ED Notes (Signed)
Per pt unable to provide urine collection at this time. 

## 2020-02-24 NOTE — ED Provider Notes (Signed)
[facility] EMERGENCY DEPARTMENT Provider Note   CSN: [id number] Arrival date & time: 02/24/20  1844     History Chief Complaint  Patient presents with  . Fever  . Testicle Pain    Steve Turner is a 24 y.o. male presenting for evaluation of left testicular pain and swelling.  Patient states 4 days ago he developed left testicular pain and swelling.  He states the day before he had been doing heavy lifting.  Since then, pain is constant, nothing makes it better or worse.  He reports associated fevers.  No nausea, vomiting, urinary symptoms.  No penile discharge.  He is sexually active with multiple women, states he was uses condoms.  He is not concerned about STDs.  He has no other medical problems, takes no medications daily.  HPI     History reviewed. No pertinent past medical history.  Patient Active Problem List   Diagnosis Date Noted  . Severe major depression, single episode, without psychotic features (HCC) 04/30/2019  . Alcohol dependence with alcohol-induced mood disorder (HCC)   . 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 No pertinent surgical history.     No family history on file.  Social History   Tobacco Use  . Smoking status: Current Every Day Smoker    Packs/day: 0.00    Types: Cigars  . Smokeless tobacco: Never Used  . Tobacco comment: Smokes Black and Mild - 4-5 per day  Vaping Use  . Vaping Use: Never assessed  Substance Use Topics  . Alcohol use: Yes    Comment: drinks most days-1 case of beer or 2 fifths of liquor  . Drug use: Yes    Types: Marijuana    Home Medications Prior to Admission medications   Medication Sig Start Date End Date Taking? Authorizing Provider  Multiple Vitamin (MULTIVITAMIN WITH MINERALS) TABS tablet Take 1 tablet by mouth daily. 05/05/19  Yes Aldean Baker, NP  acamprosate (CAMPRAL) 333 MG tablet Take 2 tablets (666 mg total) by mouth 3 (three) times  daily with meals. 05/04/19   Aldean Baker, NP  doxycycline (VIBRAMYCIN) 100 MG capsule Take 1 capsule (100 mg total) by mouth 2 (two) times daily. 02/24/20   Hydie Langan, PA-C  FLUoxetine (PROZAC) 20 MG capsule Take 1 capsule (20 mg total) by mouth daily. 05/05/19   Aldean Baker, NP  HYDROcodone-acetaminophen (NORCO/VICODIN) 5-325 MG tablet Take 1 tablet by mouth every 6 (six) hours as needed for severe pain. 02/24/20   Jaida Basurto, PA-C  nicotine (NICODERM CQ - DOSED IN MG/24 HOURS) 21 mg/24hr patch Place 1 patch (21 mg total) onto the skin daily. 05/05/19   Aldean Baker, NP    Allergies    Patient has no known allergies.  Review of Systems   Review of Systems  Constitutional: Positive for fever.  Genitourinary: Positive for scrotal swelling and testicular pain.  All other systems reviewed and are negative.   Physical Exam Updated Vital Signs BP 118/71 (BP Location: Right Arm)   Pulse 97   Temp 99 F (37.2 C) (Oral)   Resp 18   Ht 5\' 7"  (1.702 m)   Wt 72.6 kg   SpO2 99%   BMI 25.06 kg/m   Physical Exam Vitals and nursing note reviewed. Exam conducted with a chaperone present.  Constitutional:      General: He is not in acute distress.    Appearance: He is well-developed.  Comments: Patient appears uncomfortable due to pain  HENT:     Head: Normocephalic and atraumatic.  Eyes:     Conjunctiva/sclera: Conjunctivae normal.     Pupils: Pupils are equal, round, and reactive to light.  Cardiovascular:     Rate and Rhythm: Normal rate and regular rhythm.     Pulses: Normal pulses.  Pulmonary:     Effort: Pulmonary effort is normal. No respiratory distress.     Breath sounds: Normal breath sounds. No wheezing.  Abdominal:     General: Bowel sounds are normal. There is no distension.     Palpations: Abdomen is soft.     Tenderness: There is no abdominal tenderness.  Genitourinary:    Testes:        Left: Tenderness and swelling present.     Epididymis:       Left: Tenderness present.     Comments: Mild swelling of the left testicle.  Tenderness palpation along the epididymis.  No penile discharge.  No obvious lesions.  No inguinal lymphadenopathy.  No obvious hernia. Musculoskeletal:        General: Normal range of motion.     Cervical back: Normal range of motion and neck supple.  Skin:    General: Skin is warm and dry.     Capillary Refill: Capillary refill takes less than 2 seconds.  Neurological:     Mental Status: He is alert and oriented to person, place, and time.     ED Results / Procedures / Treatments   Labs (all labs ordered are listed, but only abnormal results are displayed) Labs Reviewed  URINALYSIS, ROUTINE W REFLEX MICROSCOPIC - Abnormal; Notable for the following components:      Result Value   Ketones, ur 15 (*)    All other components within normal limits  CBC WITH DIFFERENTIAL/PLATELET - Abnormal; Notable for the following components:   WBC 22.4 (*)    Neutro Abs 18.7 (*)    Monocytes Absolute 1.9 (*)    Abs Immature Granulocytes 0.22 (*)    All other components within normal limits  COMPREHENSIVE METABOLIC PANEL - Abnormal; Notable for the following components:   Sodium 134 (*)    Potassium 3.4 (*)    Glucose, Bld 108 (*)    All other components within normal limits  RPR  HIV ANTIBODY (ROUTINE TESTING W REFLEX)  GC/CHLAMYDIA PROBE AMP ([place]) NOT AT [facility]    EKG None  Radiology US SCROTUM W/DOPPLER  Result Date: 02/24/2020 CLINICAL DATA:  Left scrotal pain for 5 days EXAM: SCROTAL ULTRASOUND DOPPLER ULTRASOUND OF THE TESTICLES TECHNIQUE: Complete ultrasound examination of the testicles, epididymis, and other scrotal structures was performed. Color and spectral Doppler ultrasound were also utilized to evaluate blood flow to the testicles. COMPARISON:  None. FINDINGS: Right testicle Measurements: 4.2 x 2.2 x 2.4 cm. No mass or microlithiasis visualized. Left testicle Measurements: 4.3 x 2.7 x 2.8  cm mild heterogeneity with increased color vascularity on side-by-side comparison imaging (still images 2, 3 of 31). No mass or microlithiasis visualized. Right epididymis:  Normal in size and appearance. Left epididymis: Asymmetric enlargement with hypervascularity. Epididymal appendage noted as well, anatomic variant. Hydrocele: Moderate left hydrocele with some thin internal septations and low level echoes within the larger collections of fluid. Could reflect developing pyocele. No right hydrocele. Varicocele:  Mild bilateral varicoceles are noted Pulsed Doppler interrogation of both testes demonstrates normal low resistance arterial and venous waveforms bilaterally. IMPRESSION: 1. Mild heterogeneity with hypervascularity of  both the left testis and epididymis. Asymmetric left hydrocele with internal septations as well. Could reflect a unilateral epididymo-orchitis with a possible pyocele. 2. No evidence of testicular torsion at this time. 3. Mild bilateral varicoceles. Electronically Signed   By: Kreg Shropshire M.D.   On: 02/24/2020 19:43    Procedures Procedures (including critical care time)  Medications Ordered in ED Medications  0.9 %  sodium chloride infusion ( Intravenous Stopped 02/24/20 2107)  acetaminophen (TYLENOL) tablet 650 mg (650 mg Oral Given 02/24/20 1901)  morphine 4 MG/ML injection 4 mg (4 mg Intravenous Given 02/24/20 2038)  cefTRIAXone (ROCEPHIN) 1 g in sodium chloride 0.9 % 100 mL IVPB ( Intravenous Stopped 02/24/20 2034)    ED Course  I have reviewed the triage vital signs and the nursing notes.  Pertinent labs & imaging results that were available during my care of the patient were reviewed by me and considered in my medical decision making (see chart for details).    MDM Rules/Calculators/A&P                          Patient presenting for evaluation of testicular pain and swelling.  On exam, patient appears uncomfortable.  He is febrile and tachycardic.  Concern for  infection.  Concern for torsion.  Concern for hernia.  Will obtain labs, ultrasound, treat for pain, fever, infection, and reassess. Case discussed with attending, Dr. Lockie Mola evaluated the pt.   Ultrasound negative for torsion.  Does show inflammation, most likely consistent with infection.  Considering patient's age and high risk social behavior, likely STI in nature.  Labs show leukocytosis, consistent with infection.  Patient's heart rate and fever resolved as expected with antipyretics.  On reassessment, he appears much more comfortable.  Vital signs are normal.  Discussed findings.  Discussed importance of taking antibiotics as prescribed.  Short course of pain medication given.  Encourage patient to follow-up with urology.  At this time, patient appears a for discharge.  Return precautions given.  Patient states he understands and agrees to plan.  Final Clinical Impression(s) / ED Diagnoses Final diagnoses:  Epididymo-orchitis  Swelling of left testicle    Rx / DC Orders ED Discharge Orders         Ordered    doxycycline (VIBRAMYCIN) 100 MG capsule  2 times daily        02/24/20 2053    HYDROcodone-acetaminophen (NORCO/VICODIN) 5-325 MG tablet  Every 6 hours PRN        02/24/20 2053           Alveria Apley, PA-C 02/24/20 2140    Virgina Norfolk, DO 02/24/20 2150

## 2020-02-25 LAB — GC/CHLAMYDIA PROBE AMP (~~LOC~~) NOT AT ARMC
Chlamydia: POSITIVE — AB
Comment: NEGATIVE
Comment: NORMAL
Neisseria Gonorrhea: NEGATIVE

## 2020-02-25 LAB — RPR: RPR Ser Ql: NONREACTIVE

## 2020-02-26 LAB — HIV ANTIBODY (ROUTINE TESTING W REFLEX): HIV Screen 4th Generation wRfx: NONREACTIVE

## 2021-11-24 ENCOUNTER — Ambulatory Visit
Admission: EM | Admit: 2021-11-24 | Discharge: 2021-11-24 | Disposition: A | Discharge status: Home / Self Care | Payer: 59 | Attending: Internal Medicine | Admitting: Internal Medicine

## 2021-11-24 DIAGNOSIS — R369 Urethral discharge, unspecified: Secondary | ICD-10-CM

## 2021-11-24 DIAGNOSIS — N4889 Other specified disorders of penis: Secondary | ICD-10-CM | POA: Insufficient documentation

## 2021-11-24 DIAGNOSIS — Z113 Encounter for screening for infections with a predominantly sexual mode of transmission: Secondary | ICD-10-CM | POA: Insufficient documentation

## 2021-11-24 MED ORDER — CEFTRIAXONE SODIUM 500 MG IJ SOLR
500.0000 mg | Freq: Once | INTRAMUSCULAR | Status: AC
  Administered 2021-11-24: 500 mg via INTRAMUSCULAR

## 2021-11-24 MED ORDER — DOXYCYCLINE HYCLATE 100 MG PO CAPS: 100.0000 mg | ORAL_CAPSULE | Freq: Two times a day (BID) | ORAL | 0 refills | Status: AC

## 2021-11-24 NOTE — ED Triage Notes (Signed)
Pt c/o feeling like stabbing sharp pain to distal urethra with crusting discharge onset ~ 4-5 days ago.

## 2021-11-24 NOTE — ED Provider Notes (Signed)
[facility]    CSN: [id number] Arrival date & time: 11/24/21  2694      History   Chief Complaint Chief Complaint  Patient presents with   sti screening    HPI Steve Turner is a 26 y.o. male.   Patient presents with distal penile pain and green penile discharge that started about 4 to 5 days ago.  Patient did have unprotected sexual intercourse prior to symptoms starting but denies any confirmed exposure to STD.  Denies urinary burning, urinary frequency, testicular pain, back pain, fever.     History reviewed. No pertinent past medical history.  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 No pertinent surgical history.     Home Medications    Prior to Admission medications   Medication Sig Start Date End Date Taking? Authorizing Provider  doxycycline (VIBRAMYCIN) 100 MG capsule Take 1 capsule (100 mg total) by mouth 2 (two) times daily. 11/24/21  Yes , Acie Fredrickson, FNP  acamprosate (CAMPRAL) 333 MG tablet Take 2 tablets (666 mg total) by mouth 3 (three) times daily with meals. 05/04/19   Aldean Baker, NP  FLUoxetine (PROZAC) 20 MG capsule Take 1 capsule (20 mg total) by mouth daily. 05/05/19   Aldean Baker, NP  HYDROcodone-acetaminophen (NORCO/VICODIN) 5-325 MG tablet Take 1 tablet by mouth every 6 (six) hours as needed for severe pain. 02/24/20   Caccavale, Sophia, PA-C  Multiple Vitamin (MULTIVITAMIN WITH MINERALS) TABS tablet Take 1 tablet by mouth daily. 05/05/19   Aldean Baker, NP  nicotine (NICODERM CQ - DOSED IN MG/24 HOURS) 21 mg/24hr patch Place 1 patch (21 mg total) onto the skin daily. 05/05/19   Aldean Baker, NP    Family History History reviewed. No pertinent family history.  Social  History Social History   Tobacco Use   Smoking status: Every Day    Packs/day: 0.00    Types: Cigars, Cigarettes   Smokeless tobacco: Never   Tobacco comments:    Smokes Black and Mild - 4-5 per day  Substance Use Topics   Alcohol use: Yes    Comment: drinks most days-1 case of beer or 2 fifths of liquor   Drug use: Yes    Types: Marijuana     Allergies   Patient has no known allergies.   Review of Systems Review of Systems Per HPI  Physical Exam Triage Vital Signs ED Triage Vitals [11/24/21 0902]  Enc Vitals Group     BP 134/78     Pulse Rate 76     Resp 18     Temp 98 F (36.7 C)     Temp Source Oral     SpO2 98 %     Weight      Height      Head Circumference      Peak Flow      Pain Score 0     Pain Loc      Pain Edu?      Excl. in GC?    No data found.  Updated Vital Signs BP 134/78 (BP Location: Left Arm)   Pulse 76   Temp 98 F (36.7 C) (Oral)   Resp 18   SpO2 98%   Visual Acuity Right Eye  Distance:   Left Eye Distance:   Bilateral Distance:    Right Eye Near:   Left Eye Near:    Bilateral Near:     Physical Exam Constitutional:      General: He is not in acute distress.    Appearance: Normal appearance. He is not toxic-appearing or diaphoretic.  HENT:     Head: Normocephalic and atraumatic.  Eyes:     Extraocular Movements: Extraocular movements intact.     Conjunctiva/sclera: Conjunctivae normal.  Pulmonary:     Effort: Pulmonary effort is normal.  Genitourinary:    Comments: Deferred with shared decision-making.  Self swab performed. Neurological:     General: No focal deficit present.     Mental Status: He is alert and oriented to person, place, and time. Mental status is at baseline.  Psychiatric:        Mood and Affect: Mood normal.        Behavior: Behavior normal.        Thought Content: Thought content normal.        Judgment: Judgment normal.      UC Treatments / Results  Labs (all labs ordered are listed,  but only abnormal results are displayed) Labs Reviewed  CYTOLOGY, (ORAL, ANAL, URETHRAL) ANCILLARY ONLY    EKG   Radiology No results found.  Procedures Procedures (including critical care time)  Medications Ordered in UC Medications  cefTRIAXone (ROCEPHIN) injection 500 mg (500 mg Intramuscular Given 11/24/21 0926)    Initial Impression / Assessment and Plan / UC Course  I have reviewed the triage vital signs and the nursing notes.  Pertinent labs & imaging results that were available during my care of the patient were reviewed by me and considered in my medical decision making (see chart for details).     Highly suspicious of STD given patient's associated symptoms.  Will opt to treat with IM Rocephin for gonorrhea and doxycycline for chlamydia.  Cytology swab pending.  Will await results for any further treatment.  Advised patient to refrain from sexual activity until test results and treatment are complete.  Discussed safe sex practices.  Patient to notify sexual partners to have them tested and treated if test results are positive.  Discussed return precautions.  Patient verbalized understanding and was agreeable with plan. Final Clinical Impressions(s) / UC Diagnoses   Final diagnoses:  Penile pain  Penile discharge  Screening examination for venereal disease     Discharge Instructions      Highly suspicious that you could have an STD so your test is pending.  You were given an antibiotic injection today to cover for STD as well as being sent antibiotic pills to the pharmacy.  Please take these pills with food so as to prevent nausea.  Also recommend that you wear sunscreen if you are going to be outside in the sun as it can make you more prone to sunburn.    ED Prescriptions     Medication Sig Dispense Auth. Provider   doxycycline (VIBRAMYCIN) 100 MG capsule Take 1 capsule (100 mg total) by mouth 2 (two) times daily. 20 capsule Gustavus Bryant, [place]      PDMP not  reviewed this encounter.   Gustavus Bryant, [place] 11/24/21 [phone]

## 2021-11-24 NOTE — Discharge Instructions (Addendum)
Highly suspicious that you could have an STD so your test is pending.  You were given an antibiotic injection today to cover for STD as well as being sent antibiotic pills to the pharmacy.  Please take these pills with food so as to prevent nausea.  Also recommend that you wear sunscreen if you are going to be outside in the sun as it can make you more prone to sunburn.

## 2021-11-25 LAB — CYTOLOGY, (ORAL, ANAL, URETHRAL) ANCILLARY ONLY
Chlamydia: NEGATIVE
Comment: NEGATIVE
Comment: NEGATIVE
Comment: NORMAL
Neisseria Gonorrhea: POSITIVE — AB
Trichomonas: NEGATIVE

## 2022-05-01 ENCOUNTER — Inpatient Hospital Stay: Admit: 2022-05-01 | Discharge: 2022-05-01 | Disposition: A | Discharge status: Home / Self Care

## 2022-05-01 ENCOUNTER — Emergency Department: Admit: 2022-05-01

## 2022-05-01 DIAGNOSIS — K920 Hematemesis: Secondary | ICD-10-CM

## 2022-05-01 LAB — CBC WITH AUTO DIFFERENTIAL
Absolute Immature Granulocyte: 0 10*3/uL (ref 0.0–0.5)
Basophils %: 1 % (ref 0.0–2.0)
Basophils Absolute: 0 10*3/uL (ref 0.0–0.2)
Eosinophils %: 1 % (ref 0.5–7.8)
Eosinophils Absolute: 0.1 10*3/uL (ref 0.0–0.8)
Hematocrit: 45.7 % (ref 41.1–50.3)
Hemoglobin: 14.8 g/dL (ref 13.6–17.2)
Immature Granulocytes: 0 % (ref 0.0–5.0)
Lymphocytes %: 22 % (ref 13–44)
Lymphocytes Absolute: 1.3 10*3/uL (ref 0.5–4.6)
MCH: 27.3 PG (ref 26.1–32.9)
MCHC: 32.4 g/dL (ref 31.4–35.0)
MCV: 84.2 FL (ref 82–102)
MPV: 9.3 FL — ABNORMAL LOW (ref 9.4–12.3)
Monocytes %: 6 % (ref 4.0–12.0)
Monocytes Absolute: 0.3 10*3/uL (ref 0.1–1.3)
Neutrophils %: 70 % (ref 43–78)
Neutrophils Absolute: 4.1 10*3/uL (ref 1.7–8.2)
Platelets: 324 10*3/uL (ref 150–450)
RBC: 5.43 M/uL (ref 4.23–5.6)
RDW: 15.5 % — ABNORMAL HIGH (ref 11.9–14.6)
WBC: 5.8 10*3/uL (ref 4.3–11.1)
nRBC: 0 10*3/uL (ref 0.0–0.2)

## 2022-05-01 LAB — COMPREHENSIVE METABOLIC PANEL
ALT: 43 U/L (ref 12–65)
AST: 42 U/L — ABNORMAL HIGH (ref 15–37)
Albumin/Globulin Ratio: 1.1 (ref 0.4–1.6)
Albumin: 3.6 g/dL (ref 3.5–5.0)
Alk Phosphatase: 66 U/L (ref 50–136)
Anion Gap: 4 mmol/L (ref 2–11)
BUN: 9 MG/DL (ref 6–23)
CO2: 26 mmol/L (ref 21–32)
Calcium: 8.8 MG/DL (ref 8.3–10.4)
Chloride: 107 mmol/L (ref 103–113)
Creatinine: 0.9 MG/DL (ref 0.8–1.5)
Est, Glom Filt Rate: >60 mL/min/{1.73_m2} (ref >60)
Globulin: 3.4 g/dL (ref 2.8–4.5)
Glucose: 92 mg/dL (ref 65–100)
Potassium: 4.1 mmol/L (ref 3.5–5.1)
Sodium: 137 mmol/L (ref 136–146)
Total Bilirubin: 0.7 MG/DL (ref 0.2–1.1)
Total Protein: 7 g/dL (ref 6.3–8.2)

## 2022-05-01 LAB — POCT URINALYSIS DIPSTICK
Bilirubin, Urine, POC: NEGATIVE
Blood, UA POC: NEGATIVE
Glucose, UA POC: NEGATIVE mg/dL
Ketones, Urine, POC: NEGATIVE mg/dL
Nitrite, Urine, POC: NEGATIVE
Specific Gravity, Urine, POC: 1.025 — ABNORMAL HIGH (ref 1.001–1.023)
URINE UROBILINOGEN POC: 0.2 EU/dL (ref 0.2–1.0)
pH, Urine, POC: 8 (ref 5.0–9.0)

## 2022-05-01 LAB — URINALYSIS, MICRO: BACTERIA, URINE: NEGATIVE /hpf

## 2022-05-01 LAB — LIPASE: Lipase: 94 U/L (ref 73–393)

## 2022-05-01 LAB — ETHANOL: Ethanol Lvl: 101 MG/DL

## 2022-05-01 MED ORDER — PANTOPRAZOLE SODIUM 40 MG IV SOLR
40 MG | Freq: Every day | INTRAVENOUS | Status: DC
Start: 2022-05-01 — End: 2022-05-01
  Administered 2022-05-01: 18:00:00 40 mg via INTRAVENOUS

## 2022-05-01 MED ORDER — ONDANSETRON HCL 4 MG/2ML IJ SOLN
4 MG/2ML | INTRAMUSCULAR | Status: AC
Start: 2022-05-01 — End: 2022-05-01
  Administered 2022-05-01: 18:00:00 4 mg via INTRAVENOUS

## 2022-05-01 MED ORDER — SODIUM CHLORIDE 0.9 % IV BOLUS
0.9 % | INTRAVENOUS | Status: AC
Start: 2022-05-01 — End: 2022-05-01
  Administered 2022-05-01: 18:00:00 1000 mL via INTRAVENOUS

## 2022-05-01 MED ORDER — ONDANSETRON HCL 4 MG PO TABS
4 MG | ORAL_TABLET | Freq: Three times a day (TID) | ORAL | 0 refills | Status: AC | PRN
Start: 2022-05-01 — End: 2022-05-05

## 2022-05-01 MED ORDER — SODIUM CHLORIDE (PF) 0.9 % IJ SOLN
0.9 % | Freq: Once | INTRAMUSCULAR | Status: AC
Start: 2022-05-01 — End: 2022-05-01
  Administered 2022-05-01: 18:00:00 1 mg via INTRAVENOUS

## 2022-05-01 MED FILL — LORAZEPAM 2 MG/ML IJ SOLN: 2 MG/ML | INTRAMUSCULAR | Qty: 1

## 2022-05-01 MED FILL — ONDANSETRON HCL 4 MG/2ML IJ SOLN: 4 MG/2ML | INTRAMUSCULAR | Qty: 2

## 2022-05-01 MED FILL — PANTOPRAZOLE SODIUM 40 MG IV SOLR: 40 MG | INTRAVENOUS | Qty: 40

## 2022-05-01 NOTE — ED Provider Notes (Signed)
Emergency Department Provider Note       PCP: No primary care provider on file.   Age: 27 y.o.   Sex: male     DISPOSITION Decision To Discharge 05/01/2022 02:16:41 PM       ICD-10-CM    1. Hematemesis with nausea  K92.0       2. Nausea and vomiting, unspecified vomiting type  R11.2           Medical Decision Making     Complexity of Problems Addressed:  Acute illness    Data Reviewed and Analyzed:  I independently ordered and reviewed each unique test.         I interpreted the X-rays acute abd series without any free air.    Discussion of management or test interpretation.  Patient is a 27 year old male who presents with nausea and vomiting.  He states he has had 5 bouts of vomiting last night and noticed that there appeared to be blood mixed in with his vomit.  Woke up this morning went to go to work and had 1 more episode of vomiting again with some slight blood tinge but not as bad as the night before.  He is a daily alcohol drinker.  States that this is never happened before is complaining of some epigastric discomfort.  He is afebrile, hypertensive otherwise vital signs stable on exam.  He denies feeling lightheaded or dizzy.  He does have some mild discomfort with palpation of the epigastric region.  Will check CBC, CMP, lipase urine.  Will obtain acute abdominal series.  Patient was given Zofran, pantoprazole, IV fluids and Ativan as he reports feeling anxious.    Labs Reviewed   CBC WITH AUTO DIFFERENTIAL - Abnormal; Notable for the following components:       Result Value    RDW 15.5 (*)     MPV 9.3 (*)     All other components within normal limits   COMPREHENSIVE METABOLIC PANEL - Abnormal; Notable for the following components:    AST 42 (*)     All other components within normal limits   URINALYSIS, MICRO - Abnormal; Notable for the following components:    WBC, UA 5-10 (*)     All other components within normal limits   POCT URINALYSIS DIPSTICK - Abnormal; Notable for the following components:     Specific Gravity, Urine, POC 1.025 (*)     Protein, Urine, POC TRACE (*)     Leukocyte Est, UA POC TRACE (*)     All other components within normal limits   LIPASE   ETHANOL   Labs revealed that his hemoglobin is greater than 14, the rest of his blood work is essentially unremarkable.  On reevaluation he is resting comfortably in bed he states he is feeling much better with medications provided epigastric discomfort resolved at this time.  He was given fluids and was able to pass p.o. challenge.  Will DC home was given prescription for Zofran  Instructed to follow-up closely with his doctor this week for further evaluation.  He was also given information for the feeding center advised to call to help with any issues with alcohol addiction.  Discussed at length reasons to return immediately to the ER.  All agreeable plan        Risk of Complications and/or Morbidity of Patient Management:  Prescription drug management performed.  Shared medical decision making was utilized in creating the patients health plan today.  History       Patient is a 27 year old male who presents with complaint of hematemesis.  He reports that he is a nearly daily drinker of alcohol and last night he began to feel nauseated and had 5 bouts of emesis reporting that there was blood mixed in with his vomit.  He woke up this morning and went to work felt nauseated had 1 episode of emesis which was still slightly blood-tinged which concerned him and prompted him to come to the ER.  States he has been in rehab before but has not helped.  He has never had a seizure from withdrawals.  Last time he drank was around 6 AM this morning.  He is still feeling nauseated and is having some mild discomfort to the epigastric region.  He also reports feeling very anxious at this time.  No pain in his chest, no shortness of breath, no dizziness or lightheadedness upon standing.  Also reports some discomfort in his low back but states that that has been  ongoing.    The history is provided by the patient.        Review of Systems   Constitutional:  Negative for activity change, appetite change, chills, fatigue and fever.   HENT:  Negative for congestion and sore throat.    Eyes:  Negative for redness.   Respiratory:  Negative for cough and shortness of breath.    Cardiovascular:  Negative for chest pain.   Gastrointestinal:  Positive for abdominal pain, nausea and vomiting. Negative for diarrhea.        Hematemesis   Genitourinary:  Negative for hematuria.   Musculoskeletal:  Negative for back pain and neck pain.   Skin:  Negative for rash.   Neurological:  Negative for light-headedness and headaches.   Psychiatric/Behavioral:  Negative for confusion. The patient is nervous/anxious.        Physical Exam     Vitals signs and nursing note reviewed:  Vitals:    05/01/22 1344 05/01/22 1345 05/01/22 1415 05/01/22 1430   BP:  (!) 142/83 (!) 142/85 (!) 140/86   Pulse:       Resp:       Temp:       TempSrc:       SpO2: 98% 98% 100% 100%   Weight:       Height:          Physical Exam  Vitals and nursing note reviewed.   Constitutional:       General: He is not in acute distress.     Appearance: He is not ill-appearing or toxic-appearing.   HENT:      Head: Normocephalic and atraumatic.   Cardiovascular:      Rate and Rhythm: Normal rate.      Pulses: Normal pulses.   Pulmonary:      Effort: Pulmonary effort is normal.      Breath sounds: Normal breath sounds.   Abdominal:      Palpations: Abdomen is soft.      Tenderness: There is abdominal tenderness in the epigastric area. There is no right CVA tenderness, left CVA tenderness, guarding or rebound.   Skin:     General: Skin is warm and dry.   Neurological:      General: No focal deficit present.      Mental Status: He is alert and oriented to person, place, and time.   Psychiatric:         Mood and Affect: Mood  normal.         Behavior: Behavior normal.          Procedures     Procedures    Orders Placed This Encounter    Procedures    XR ACUTE ABD SERIES CHEST 1 VW    CBC with Auto Differential    CMP    Lipase    Alcohol    Urinalysis, Micro    POCT Urine Dipstick    POCT Urinalysis no Micro    Saline lock IV        Medications given during this emergency department visit:  Medications   pantoprazole (PROTONIX) 40 mg in sodium chloride (PF) 0.9 % 10 mL injection (40 mg IntraVENous Given 05/01/22 1318)   sodium chloride 0.9 % bolus 1,000 mL (0 mLs IntraVENous Stopped 05/01/22 1430)   ondansetron (ZOFRAN) injection 4 mg (4 mg IntraVENous Given 05/01/22 1316)   LORazepam (ATIVAN) 1 mg in sodium chloride (PF) 0.9 % 10 mL injection (1 mg IntraVENous Given 05/01/22 1316)       Discharge Medication List as of 05/01/2022  2:30 PM        START taking these medications    Details   ondansetron (ZOFRAN) 4 MG tablet Take 1 tablet by mouth every 8 hours as needed for Nausea or Vomiting, Disp-12 tablet, R-0Print              No past medical history on file.     No past surgical history on file.     Social History     Socioeconomic History    Marital status: Single        Discharge Medication List as of 05/01/2022  2:30 PM           Results for orders placed or performed during the hospital encounter of 05/01/22   XR ACUTE ABD SERIES CHEST 1 VW    Narrative    Abdominal Series    INDICATION: Hemoptysis    COMPARISON:  None    TECHNIQUE:  A single view of the chest and 2 views of the abdomen were obtained.    FINDINGS: The lungs are clear. There are no infiltrates or effusions. The heart  size is normal.     Flat and upright views of the abdomen show a normal bowel gas pattern with fecal  retention in the bowel loop. There is no evidence of obstruction. There is no  free air.  No renal calculi are seen.      Impression    Fecal retention in the bowel loop. No acute findings in the chest or  abdomen.     CBC with Auto Differential   Result Value Ref Range    WBC 5.8 4.3 - 11.1 K/uL    RBC 5.43 4.23 - 5.6 M/uL    Hemoglobin 14.8 13.6 - 17.2 g/dL     Hematocrit 45.7 41.1 - 50.3 %    MCV 84.2 82 - 102 FL    MCH 27.3 26.1 - 32.9 PG    MCHC 32.4 31.4 - 35.0 g/dL    RDW 15.5 (H) 11.9 - 14.6 %    Platelets 324 150 - 450 K/uL    MPV 9.3 (L) 9.4 - 12.3 FL    nRBC 0.00 0.0 - 0.2 K/uL    Differential Type AUTOMATED      Neutrophils % 70 43 - 78 %    Lymphocytes % 22 13 - 44 %    Monocytes %  6 4.0 - 12.0 %    Eosinophils % 1 0.5 - 7.8 %    Basophils % 1 0.0 - 2.0 %    Immature Granulocytes 0 0.0 - 5.0 %    Neutrophils Absolute 4.1 1.7 - 8.2 K/UL    Lymphocytes Absolute 1.3 0.5 - 4.6 K/UL    Monocytes Absolute 0.3 0.1 - 1.3 K/UL    Eosinophils Absolute 0.1 0.0 - 0.8 K/UL    Basophils Absolute 0.0 0.0 - 0.2 K/UL    Absolute Immature Granulocyte 0.0 0.0 - 0.5 K/UL   CMP   Result Value Ref Range    Sodium 137 136 - 146 mmol/L    Potassium 4.1 3.5 - 5.1 mmol/L    Chloride 107 103 - 113 mmol/L    CO2 26 21 - 32 mmol/L    Anion Gap 4 2 - 11 mmol/L    Glucose 92 65 - 100 mg/dL    BUN 9 6 - 23 MG/DL    Creatinine 0.90 0.8 - 1.5 MG/DL    Est, Glom Filt Rate >60 >60 ml/min/1.10m2    Calcium 8.8 8.3 - 10.4 MG/DL    Total Bilirubin 0.7 0.2 - 1.1 MG/DL    ALT 43 12 - 65 U/L    AST 42 (H) 15 - 37 U/L    Alk Phosphatase 66 50 - 136 U/L    Total Protein 7.0 6.3 - 8.2 g/dL    Albumin 3.6 3.5 - 5.0 g/dL    Globulin 3.4 2.8 - 4.5 g/dL    Albumin/Globulin Ratio 1.1 0.4 - 1.6     Lipase   Result Value Ref Range    Lipase 94 73 - 393 U/L   Alcohol   Result Value Ref Range    Ethanol Lvl 101 MG/DL   Urinalysis, Micro   Result Value Ref Range    WBC, UA 5-10 (A) U4 /hpf    RBC, UA 0-5 U5 /hpf    Epithelial Cells UA 0-5 U5 /hpf    BACTERIA, URINE Negative NEG /hpf    Casts 0-2 U2 /lpf   POCT Urinalysis no Micro   Result Value Ref Range    Specific Gravity, Urine, POC 1.025 (H) 1.001 - 1.023      pH, Urine, POC 8.0 5.0 - 9.0      Protein, Urine, POC TRACE (A) NEG mg/dL    Glucose, UA POC Negative NEG mg/dL    Ketones, Urine, POC Negative NEG mg/dL    Bilirubin, Urine, POC Negative NEG      Blood,  UA POC Negative NEG      URINE UROBILINOGEN POC 0.2 0.2 - 1.0 EU/dL    Nitrite, Urine, POC Negative NEG      Leukocyte Est, UA POC TRACE (A) NEG      Performed by: Wynona Neat         XR ACUTE ABD SERIES CHEST 1 VW   Final Result   Fecal retention in the bowel loop. No acute findings in the chest or   abdomen.                           Voice dictation software was used during the making of this note.  This software is not perfect and grammatical and other typographical errors may be present.  This note has not been completely proofread for errors.     Delton See, [place]  05/01/22 1520

## 2022-05-01 NOTE — ED Notes (Signed)
I have reviewed discharge instructions with the patient.  The patient verbalized understanding.    Patient left ED via Discharge Method: ambulatory to Home with uber.    Opportunity for questions and clarification provided.       Patient given 1 scripts.         To continue your aftercare when you leave the hospital, you may receive an automated call from our care team to check in on how you are doing.  This is a free service and part of our promise to provide the best care and service to meet your aftercare needs." If you have questions, or wish to unsubscribe from this service please call [phone].  Thank you for Choosing our [facility] Emergency Department.        Marylyn Ishihara, [place]  05/01/22 1444

## 2022-05-01 NOTE — ED Triage Notes (Signed)
Pt arrives via pov ambulatory c/o vomiting blood since yesterday. 5 episodes. Reports no appetite and abd discomfort. Denies medical hx.

## 2022-05-01 NOTE — Discharge Instructions (Addendum)
Your labs and XR were unremarkable. We are sending you home with zofran which will help with any further nausea, please take every 6-8 hrs as needed. Stick to a clear liquid diet for the rest of the day and then advance your diet slowly as tolerated. You were given information for the [facility] which can help with addiction. Return immediately to the Er with any worsening symptoms.

## 2022-08-16 ENCOUNTER — Emergency Department (HOSPITAL_COMMUNITY)
Admission: EM | Admit: 2022-08-16 | Discharge: 2022-08-16 | Disposition: A | Discharge status: Home / Self Care | Payer: Medicaid Other | Attending: Emergency Medicine | Admitting: Emergency Medicine

## 2022-08-16 ENCOUNTER — Encounter (HOSPITAL_COMMUNITY): Payer: Self-pay

## 2022-08-16 ENCOUNTER — Other Ambulatory Visit: Payer: Self-pay

## 2022-08-16 DIAGNOSIS — Z0283 Encounter for blood-alcohol and blood-drug test: Secondary | ICD-10-CM | POA: Insufficient documentation

## 2022-08-16 LAB — RAPID URINE DRUG SCREEN, HOSP PERFORMED
Amphetamines: NOT DETECTED
Barbiturates: NOT DETECTED
Benzodiazepines: NOT DETECTED
Cocaine: NOT DETECTED
Opiates: NOT DETECTED
Tetrahydrocannabinol: NOT DETECTED

## 2022-08-16 NOTE — Discharge Instructions (Signed)
It was a pleasure taking care of you here in the emergency department  Your urine drug test was negative  Follow-up as needed

## 2022-08-16 NOTE — ED Provider Notes (Signed)
[place] EMERGENCY DEPARTMENT AT [facility] Provider Note   CSN: [id number] Arrival date & time: 08/16/22  1818    History  Chief Complaint  Patient presents with   Needs Drug Test    Steve Turner is a 27 y.o. male past medical history here for evaluation of drug test.  He states he is in sober living.  He left work early in the week and his facility thinks he has been using.  They did not on spot drug screen and he tested positive for THC.  He said he last used 36 days ago.  He states if he does not have documentation showing that it is negative they will kick him out of his living facility today.  He denies recent drug or alcohol use.  No nausea, vomiting or abdominal pain.  HPI     Home Medications Prior to Admission medications   Medication Sig Start Date End Date Taking? Authorizing Provider  acamprosate (CAMPRAL) 333 MG tablet Take 2 tablets (666 mg total) by mouth 3 (three) times daily with meals. 05/04/19   Aldean Baker, NP  doxycycline (VIBRAMYCIN) 100 MG capsule Take 1 capsule (100 mg total) by mouth 2 (two) times daily. 11/24/21   Gustavus Bryant, FNP  FLUoxetine (PROZAC) 20 MG capsule Take 1 capsule (20 mg total) by mouth daily. 05/05/19   Aldean Baker, NP  HYDROcodone-acetaminophen (NORCO/VICODIN) 5-325 MG tablet Take 1 tablet by mouth every 6 (six) hours as needed for severe pain. 02/24/20   Caccavale, Sophia, PA-C  Multiple Vitamin (MULTIVITAMIN WITH MINERALS) TABS tablet Take 1 tablet by mouth daily. 05/05/19   Aldean Baker, NP  nicotine (NICODERM CQ - DOSED IN MG/24 HOURS) 21 mg/24hr patch Place 1 patch (21 mg total) onto the skin daily. 05/05/19   Aldean Baker, NP      Allergies    Patient has no known allergies.    Review of Systems   Review of Systems  All other systems reviewed and are negative.   Physical Exam Updated Vital Signs BP (!) 161/101   Pulse 86   Temp 98.2 F (36.8 C) (Oral)   Resp 16   Ht 5\' 7"  (1.702 m)   Wt 73 kg   SpO2  100%   BMI 25.21 kg/m  Physical Exam Vitals and nursing note reviewed.  Constitutional:      General: He is not in acute distress.    Appearance: He is well-developed. He is not ill-appearing or diaphoretic.  HENT:     Head: Atraumatic.  Eyes:     Pupils: Pupils are equal, round, and reactive to light.  Cardiovascular:     Rate and Rhythm: Normal rate and regular rhythm.  Pulmonary:     Effort: Pulmonary effort is normal. No respiratory distress.  Abdominal:     General: There is no distension.     Palpations: Abdomen is soft.  Musculoskeletal:        General: Normal range of motion.     Cervical back: Normal range of motion and neck supple.  Skin:    General: Skin is warm and dry.  Neurological:     General: No focal deficit present.     Mental Status: He is alert and oriented to person, place, and time.     ED Results / Procedures / Treatments   Labs (all labs ordered are listed, but only abnormal results are displayed) Labs Reviewed  RAPID URINE DRUG SCREEN, HOSP PERFORMED  EKG None  Radiology No results found.  Procedures Procedures    Medications Ordered in ED Medications - No data to display  ED Course/ Medical Decision Making/ A&P   89 old here for evaluation of UDS.  He currently lives in sober living.  Left work earlier this week the drug tested him upon arrival back to his living facility tested positive for THC.  He states he has not used in 36 days.  He denies any complaints.  States he cannot return tonight unless he has a urine drug screen and they recommend he come here.  Labs personally viewed and interpreted:  UDS neg  He was provided with a copy of this  Of note he did have elevated blood pressure here.  He is currently asymptomatic, low suspicion for hypertensive urgency or emergency.  He is encouraged to follow-up with PCP for recheck, return for new or worsening symptoms  The patient has been appropriately medically screened and/or  stabilized in the ED. I have low suspicion for any other emergent medical condition which would require further screening, evaluation or treatment in the ED or require inpatient management.  Patient is hemodynamically stable and in no acute distress.  Patient able to ambulate in department prior to ED.  Evaluation does not show acute pathology that would require ongoing or additional emergent interventions while in the emergency department or further inpatient treatment.  I have discussed the diagnosis with the patient and answered all questions.  Pain is been managed while in the emergency department and patient has no further complaints prior to discharge.  Patient is comfortable with plan discussed in room and is stable for discharge at this time.  I have discussed strict return precautions for returning to the emergency department.  Patient was encouraged to follow-up with PCP/specialist refer to at discharge.                              Medical Decision Making Amount and/or Complexity of Data Reviewed External Data Reviewed: labs and notes. Labs: ordered. Decision-making details documented in ED Course.  Risk OTC drugs. Diagnosis or treatment significantly limited by social determinants of health.           Final Clinical Impression(s) / ED Diagnoses Final diagnoses:  Encounter for drug screening    Rx / DC Orders ED Discharge Orders     None         Gwendalyn Mcgonagle A, PA-C 08/16/22 1918    Derwood Kaplan, MD 08/19/22 1402

## 2022-08-16 NOTE — ED Triage Notes (Signed)
Patient stays in sober living and needs to prove to them that he did not do any drugs. He said he left work early one day and the sober living thought it was suspicious.
